# Patient Record
Sex: Female | Born: 1980 | Hispanic: No | Marital: Married | Smoking: Never smoker
Health system: Southern US, Community
[De-identification: ages and names within clinical notes are randomized; demographics above are authoritative.]

## PROBLEM LIST (undated history)

## (undated) DIAGNOSIS — K5792 Diverticulitis of intestine, part unspecified, without perforation or abscess without bleeding: Secondary | ICD-10-CM

## (undated) HISTORY — PX: CHOLECYSTECTOMY: SHX55

## (undated) HISTORY — PX: TUBAL LIGATION: SHX77

## (undated) HISTORY — PX: APPENDECTOMY: SHX54

---

## 2002-10-24 ENCOUNTER — Inpatient Hospital Stay (HOSPITAL_COMMUNITY): Admission: AD | Admit: 2002-10-24 | Discharge: 2002-10-24 | Payer: Self-pay | Admitting: Family Medicine

## 2012-11-29 ENCOUNTER — Ambulatory Visit: Payer: Self-pay | Admitting: Family Medicine

## 2013-01-30 ENCOUNTER — Ambulatory Visit: Payer: Self-pay | Admitting: Family Medicine

## 2013-04-08 ENCOUNTER — Inpatient Hospital Stay: Payer: Self-pay | Admitting: Obstetrics and Gynecology

## 2013-04-08 LAB — CBC WITH DIFFERENTIAL/PLATELET
Basophil #: 0.1 10*3/uL (ref 0.0–0.1)
Basophil %: 1.1 %
Eosinophil #: 0.2 10*3/uL (ref 0.0–0.7)
HGB: 13.7 g/dL (ref 12.0–16.0)
MCV: 95 fL (ref 80–100)
Monocyte #: 0.8 x10 3/mm (ref 0.2–0.9)
Neutrophil #: 6.3 10*3/uL (ref 1.4–6.5)
Neutrophil %: 61 %
RBC: 4.12 10*6/uL (ref 3.80–5.20)
RDW: 13.4 % (ref 11.5–14.5)

## 2013-04-09 LAB — HEMOGLOBIN: HGB: 12.1 g/dL (ref 12.0–16.0)

## 2013-04-10 LAB — HEMATOCRIT: HCT: 33.7 % — ABNORMAL LOW (ref 35.0–47.0)

## 2013-04-10 LAB — PATHOLOGY REPORT

## 2013-04-11 LAB — CBC WITH DIFFERENTIAL/PLATELET
Basophil #: 0 10*3/uL (ref 0.0–0.1)
Basophil %: 0.3 %
Eosinophil #: 0.3 10*3/uL (ref 0.0–0.7)
Eosinophil %: 2.9 %
HCT: 32 % — ABNORMAL LOW (ref 35.0–47.0)
HGB: 11 g/dL — ABNORMAL LOW (ref 12.0–16.0)
Monocyte #: 0.7 x10 3/mm (ref 0.2–0.9)
Monocyte %: 7.1 %
RDW: 13.4 % (ref 11.5–14.5)
WBC: 9.9 10*3/uL (ref 3.6–11.0)

## 2015-03-12 NOTE — Op Note (Signed)
PATIENT NAME:  Barbara Gregory, Barbara Gregory MR#:  [id number] DATE OF BIRTH:  1981/04/19  DATE OF PROCEDURE:  04/08/2013  PREOPERATIVE DIAGNOSES: 1.  Term gestation.  2.  Active labor.  3.  Previous cesarean section.  4.  Desires repeat cesarean section.  5.  Desires tubal ligation.   POSTOPERATIVE DIAGNOSES:  1.  Term gestation.  2.  Active labor.  3.  Previous cesarean section.  4.  Desires repeat cesarean section.  5.  Desires tubal ligation. 6.  Infant with suspected trisomy 26.   PROCEDURE:  Repeat low transverse cesarean section, bilateral tubal ligation and On-Q pump placement.    SURGEON:  Ricky L. Logan Bores, M.D.   ASSISTBabette Relic.   FINDINGS:  Postsurgical abdomen, grossly normal uterus, tubes, ovaries, placenta.   ESTIMATED BLOOD LOSS:  500 mL.   COMPLICATIONS:  None.   DRAINS:  Foley.    PREOPERATIVE ANTIBIOTICS:  2 grams Ancef IV.   PAIN MANAGEMENT:  On-Q pump placement.   PROCEDURE IN DETAIL:  The patient presented in active labor at 4 cm, 100% and minus 2.  The patient reiterated desire for repeat cesarean and for tubal ligation.   The patient pointed out she had a piercing in her mid-sternum.  We have informed her that she is at risk for thermal burn due to the fact we are using monopolar Bovie cautery.  She states she did not desire the piercing removed, therefore we told her we would place tape on it and we will instruct her to keep it moist and general wound care should a burn develop.  She stated understanding and agreed.   After obtaining consent, the patient was taken to the operating room and placed in the sitting position where spinal was placed and placed in the supine position.  Foley catheter was inserted.  Abdomen was prepped and draped in the usual sterile fashion.  After assuring adequate anesthesia, a #10 blade was used to create a Pfannenstiel incision through old scar.  Primary low transverse cesarean section was carried out in the usual fashion.  The uterine cavity  was manually wiped with a towel.  Utetrus exteriorized placenta was removed.  The incision was closed with running interlocking of 0 chromic.  O'Leary stitch was required at the left uterine artery.  Next, we turned our attention to the fallopian tubes.    The right fallopian tube was visualized and grasped in relative avascular portion at the distal portion of the ampullary region (to avoid a large vein in the mesosalpinx) and a modified Pomeroy with a double ligation of 0 plain gut was carried out.  The segment of tube was cut free, sent as right segment and stumps were cauterized.  Procedure in a similar fashion on the left.   The uterus was returned to the abdominal cavity, copiously irrigated.  Surgical sites x 3 were seen to be hemostatic.  The rectus muscle was hemostatic.  The fascia was closed left to midline, and then On-Q catheters were placed and draped bilaterally over the underlying rectus and then the rest of the fascia was closed by continuing the previously begun running of 0 Vicryl.  SubQ was made hemostatic with cautery.  The skin was closed with absorbable clips, and On-Q catheters were secured using skin glue, Steri-Strips.  They were flushed with 5 mL of Sensorcaine into each and secured with Tegaderm.  Catheters were labeled.    All instrument, needle and sponge counts were correct.  The patient tolerated the  procedure well.  The urine was clear throughout procedure and at the end of procedure.  I anticipate a routine postoperative course.    ____________________________ Reatha Harpsicky L. Logan BoresEvans, MD rle:ea D: 04/08/2013 23:06:14 ET T: 04/08/2013 23:22:38 ET JOB#: [id number]  cc: Clide Clifficky L. Logan BoresEvans, MD, <Dictator> Augustina MoodICK L Omayra Tulloch MD ELECTRONICALLY SIGNED 04/10/2013 9:07

## 2015-03-30 NOTE — H&P (Signed)
L&D Evaluation:  History:  HPI 34 Y/O P1 with [facility]   Presents with contractions   Patient's Medical History No Chronic Illness   Patient's Surgical History Previous C-Section   Medications Pre Natal Vitamins   Allergies NKDA   Social History none   Family History Non-Contributory   ROS:  ROS All systems were reviewed.  HEENT, CNS, GI, GU, Respiratory, CV, Renal and Musculoskeletal systems were found to be normal.   Exam:  Vital Signs stable   Mental Status clear   Abdomen gravid, tender with contractions   Estimated Fetal Weight Average for gestational age   Back no CVAT   Pelvic 4/90/-2, VTX   Mebranes Intact   FHT normal rate with no decels   Ucx regular   Other has piercing at center of chest -- pt does not desire removal She is aware of potential for skin burn at this location, as monopolar bovie is typically used Will tape as securely as we can and give instructions to keep moist if burn develops   Impression:  Impression active labor   Plan:  Comments rLTCS Desires BTL -- will perform if paperwork is located   Electronic Signatures: Margaretha GlassingEvans, Ricky L (MD)  (Signed 20-May-14 22:17)  Authored: L&D Evaluation   Last Updated: 20-May-14 22:17 by Margaretha GlassingEvans, Ricky L (MD)

## 2016-01-03 ENCOUNTER — Encounter: Payer: Self-pay | Admitting: Emergency Medicine

## 2016-01-03 ENCOUNTER — Emergency Department
Admission: EM | Admit: 2016-01-03 | Discharge: 2016-01-03 | Disposition: A | Discharge status: Home / Self Care | Payer: Self-pay | Attending: Emergency Medicine | Admitting: Emergency Medicine

## 2016-01-03 DIAGNOSIS — R5383 Other fatigue: Secondary | ICD-10-CM | POA: Insufficient documentation

## 2016-01-03 DIAGNOSIS — Z3202 Encounter for pregnancy test, result negative: Secondary | ICD-10-CM | POA: Insufficient documentation

## 2016-01-03 DIAGNOSIS — R5381 Other malaise: Secondary | ICD-10-CM | POA: Insufficient documentation

## 2016-01-03 LAB — CBC WITH DIFFERENTIAL/PLATELET
BASOS ABS: 0 10*3/uL (ref 0–0.1)
BASOS PCT: 1 %
EOS PCT: 2 %
Eosinophils Absolute: 0.2 10*3/uL (ref 0–0.7)
HCT: 38.1 % (ref 35.0–47.0)
Hemoglobin: 12.6 g/dL (ref 12.0–16.0)
Lymphocytes Relative: 27 %
Lymphs Abs: 2.3 10*3/uL (ref 1.0–3.6)
MCH: 30.5 pg (ref 26.0–34.0)
MCHC: 33.1 g/dL (ref 32.0–36.0)
MCV: 92.2 fL (ref 80.0–100.0)
MONO ABS: 0.9 10*3/uL (ref 0.2–0.9)
MONOS PCT: 10 %
Neutro Abs: 5.2 10*3/uL (ref 1.4–6.5)
Neutrophils Relative %: 60 %
PLATELETS: 239 10*3/uL (ref 150–440)
RBC: 4.13 MIL/uL (ref 3.80–5.20)
RDW: 13.5 % (ref 11.5–14.5)
WBC: 8.6 10*3/uL (ref 3.6–11.0)

## 2016-01-03 LAB — URINALYSIS COMPLETE WITH MICROSCOPIC (ARMC ONLY)
Bilirubin Urine: NEGATIVE
GLUCOSE, UA: NEGATIVE mg/dL
LEUKOCYTES UA: NEGATIVE
NITRITE: NEGATIVE
PH: 6 (ref 5.0–8.0)
Protein, ur: NEGATIVE mg/dL
Specific Gravity, Urine: 1.009 (ref 1.005–1.030)

## 2016-01-03 LAB — COMPREHENSIVE METABOLIC PANEL
ALBUMIN: 4 g/dL (ref 3.5–5.0)
ALK PHOS: 72 U/L (ref 38–126)
ALT: 24 U/L (ref 14–54)
AST: 19 U/L (ref 15–41)
Anion gap: 7 (ref 5–15)
BILIRUBIN TOTAL: 0.8 mg/dL (ref 0.3–1.2)
BUN: 16 mg/dL (ref 6–20)
CALCIUM: 8.7 mg/dL — AB (ref 8.9–10.3)
CO2: 25 mmol/L (ref 22–32)
Chloride: 105 mmol/L (ref 101–111)
Creatinine, Ser: 0.57 mg/dL (ref 0.44–1.00)
GFR calc Af Amer: >60 mL/min (ref >60)
GFR calc non Af Amer: >60 mL/min (ref >60)
GLUCOSE: 96 mg/dL (ref 65–99)
Potassium: 3.4 mmol/L — ABNORMAL LOW (ref 3.5–5.1)
Sodium: 137 mmol/L (ref 135–145)
TOTAL PROTEIN: 7.7 g/dL (ref 6.5–8.1)

## 2016-01-03 LAB — PREGNANCY, URINE: Preg Test, Ur: NEGATIVE

## 2016-01-03 MED ORDER — NAPROXEN 500 MG PO TBEC: 500.0000 mg | DELAYED_RELEASE_TABLET | Freq: Two times a day (BID) | ORAL | Status: DC

## 2016-01-03 MED ORDER — NAPROXEN 500 MG PO TABS
500.0000 mg | ORAL_TABLET | Freq: Once | ORAL | Status: AC
  Administered 2016-01-03: 500 mg via ORAL

## 2016-01-03 MED ORDER — NAPROXEN 500 MG PO TABS
ORAL_TABLET | ORAL | Status: AC
  Filled 2016-01-03: qty 1

## 2016-01-03 MED ORDER — CYCLOBENZAPRINE HCL 5 MG PO TABS: 5.0000 mg | ORAL_TABLET | Freq: Three times a day (TID) | ORAL | Status: DC | PRN

## 2016-01-03 NOTE — ED Notes (Signed)
POCT Urine Preg negative. 

## 2016-01-03 NOTE — Discharge Instructions (Signed)
Fatigue Fatigue is feeling tired all of the time, a lack of energy, or a lack of motivation. Occasional or mild fatigue is often a normal response to activity or life in general. However, long-lasting (chronic) or extreme fatigue may indicate an underlying medical condition. HOME CARE INSTRUCTIONS  Watch your fatigue for any changes. The following actions may help to lessen any discomfort you are feeling:  Talk to your health care provider about how much sleep you need each night. Try to get the required amount every night.  Take medicines only as directed by your health care provider.  Eat a healthy and nutritious diet. Ask your health care provider if you need help changing your diet.  Drink enough fluid to keep your urine clear or pale yellow.  Practice ways of relaxing, such as yoga, meditation, massage therapy, or acupuncture.  Exercise regularly.   Change situations that cause you stress. Try to keep your work and personal routine reasonable.  Do not abuse illegal drugs.  Limit alcohol intake to no more than 1 drink per day for nonpregnant women and 2 drinks per day for men. One drink equals 12 ounces of beer, 5 ounces of wine, or 1 ounces of hard liquor.  Take a multivitamin, if directed by your health care provider. SEEK MEDICAL CARE IF:   Your fatigue does not get better.  You have a fever.   You have unintentional weight loss or gain.  You have headaches.   You have difficulty:   Falling asleep.  Sleeping throughout the night.  You feel angry, guilty, anxious, or sad.   You are unable to have a bowel movement (constipation).   You skin is dry.   Your legs or another part of your body is swollen.  SEEK IMMEDIATE MEDICAL CARE IF:   You feel confused.   Your vision is blurry.  You feel faint or pass out.   You have a severe headache.   You have severe abdominal, pelvic, or back pain.   You have chest pain, shortness of breath, or an  irregular or fast heartbeat.   You are unable to urinate or you urinate less than normal.   You develop abnormal bleeding, such as bleeding from the rectum, vagina, nose, lungs, or nipples.  You vomit blood.   You have thoughts about harming yourself or committing suicide.   You are worried that you might harm someone else.    This information is not intended to replace advice given to you by your health care provider. Make sure you discuss any questions you have with your health care provider.   Document Released: 09/03/2007 Document Revised: 11/27/2014 Document Reviewed: 03/10/2014 Elsevier Interactive Patient Education [employer]   Your exam and labs were normal today. Continue to monitor symptoms and follow-up with your provider as planned.

## 2016-01-03 NOTE — ED Notes (Signed)
Body aches and breast tenderness x 2 days. Denies fevers.

## 2016-01-03 NOTE — ED Provider Notes (Signed)
[facility] Emergency Department Provider Note ____________________________________________  Time seen: 1906  I have reviewed the triage vital signs and the nursing notes.  HISTORY  Chief Complaint  Generalized Body Aches  HPI Barbara Gregory is a 35 y.o. female visits to the ED with multiple complaints of generalized body aches from the waist up. She describes the symptoms began on Friday stating that "it hurts to touch". She is describing tenderness to her breast, chest wall and flank as well as some neck pain as with stiffness and tightness. She denies any vomiting but reports nausea. She describes she has felt as if her mouth has been dry. Denies any injury, trauma, accident, fall, or bruising. She denies any excessive urination, irritability or weight changes. She does admit to some increased fatigue and sleepiness. She denies any recent travel, sick contacts, or other exposures. She denies any rashes, tick bites, bruising, or unusual bleeding. She also denies any cough, or congestion. She denies any upper respiratory symptoms wheezing, or flulike symptoms. She's been dosing her regular medications without change.She did not receive a flu vaccine for the season. She has dosed Tylenol intermittently for her pain. She reports her pain at a 10/10 in triage.  History reviewed. No pertinent past medical history.  There are no active problems to display for this patient.  Past Surgical History  Procedure Laterality Date  . Cholecystectomy    . Tubal ligation     Current Outpatient Rx  Name  Route  Sig  Dispense  Refill  . cyclobenzaprine (FLEXERIL) 5 MG tablet   Oral   Take 1 tablet (5 mg total) by mouth 3 (three) times daily as needed for muscle spasms.   15 tablet   0   . naproxen (EC NAPROSYN) 500 MG EC tablet   Oral   Take 1 tablet (500 mg total) by mouth 2 (two) times daily with a meal.   30 tablet   0     Allergies Review of patient's allergies  indicates no known allergies.  No family history on file.  Social History Social History  Substance Use Topics  . Smoking status: Never Smoker   . Smokeless tobacco: None  . Alcohol Use: No    Review of Systems  Constitutional: Negative for fever. Eyes: Negative for visual changes. ENT: Negative for sore throat. Cardiovascular: Negative for chest pain. Respiratory: Negative for shortness of breath. Gastrointestinal: Negative for abdominal pain, vomiting and diarrhea. Genitourinary: Negative for dysuria. Musculoskeletal: Negative for back pain. Skin: Negative for rash. Neurological: Negative for headaches, focal weakness or numbness. ____________________________________________  PHYSICAL EXAM:  VITAL SIGNS: ED Triage Vitals  Enc Vitals Group     BP 01/03/16 1807 144/82 mmHg     Pulse Rate 01/03/16 1807 79     Resp 01/03/16 1807 20     Temp 01/03/16 1807 98.2 F (36.8 C)     Temp Source 01/03/16 1807 Oral     SpO2 01/03/16 1807 99 %     Weight 01/03/16 1807 173 lb (78.472 kg)     Height 01/03/16 1807  (1.448 m)     Head Cir --      Peak Flow --      Pain Score 01/03/16 1807 10     Pain Loc --      Pain Edu? --      Excl. in GC? --    Constitutional: Alert and oriented. Well appearing and in no distress. Head: Normocephalic and atraumatic.  Eyes: Conjunctivae are normal. PERRL. Normal extraocular movements      Ears: Canals clear. TMs intact bilaterally.   Nose: No congestion/rhinorrhea.   Mouth/Throat: Mucous membranes are moist. Lids midline tonsils are flat. No oral lesions are appreciated.   Neck: Supple. No thyromegaly. General neck tenderness to palpation. Normal neck range of motion in all planes. Hematological/Lymphatic/Immunological: No cervical, supra/infraclavicular lymphadenopathy. Cardiovascular: Normal rate, regular rhythm.  Respiratory: Normal respiratory effort. No wheezes/rales/rhonchi. Gastrointestinal: Soft and nontender. No  distention, rebound, guarding, or organomegaly. Normal bowel sounds x 4 quadrants.  Musculoskeletal: Normal spinal alignment. No midline tenderness, spasm, deformity, or step-off. Nontender with normal range of motion in all extremities.  Neurologic:  Normal gait without ataxia. Normal speech and language. No gross focal neurologic deficits are appreciated. Skin:  Skin is warm, dry and intact. No rash noted. Psychiatric: Mood and affect are normal. Patient exhibits appropriate insight and judgment. ____________________________________________   LABS (pertinent positives/negatives) Labs Reviewed  COMPREHENSIVE METABOLIC PANEL - Abnormal; Notable for the following:    Potassium 3.4 (*)    Calcium 8.7 (*)    All other components within normal limits  URINALYSIS COMPLETEWITH MICROSCOPIC ([facility] ONLY) - Abnormal; Notable for the following:    Color, Urine STRAW (*)    APPearance CLEAR (*)    Ketones, ur 1+ (*)    Hgb urine dipstick 1+ (*)    Bacteria, UA RARE (*)    Squamous Epithelial / LPF 0-5 (*)    All other components within normal limits  CBC WITH DIFFERENTIAL/PLATELET  PREGNANCY, URINE  ____________________________________________  PROCEDURES  EC Naprosyn 500 mg PO ____________________________________________  INITIAL IMPRESSION / ASSESSMENT AND PLAN / ED COURSE  Patient with generalized bodyaches without clear etiology. She is reassured that her labs are normal at this time. She is encouraged to dose the prescription Naprosyn and Flexeril and follow with primary care provider as she has scheduled tomorrow morning at 9 AM. She is advised to return to the ED for acutely worsening symptoms. ____________________________________________  FINAL CLINICAL IMPRESSION(S) / ED DIAGNOSES  Final diagnoses:  Malaise and fatigue      Lissa Hoard, PA-C 01/03/16 2351  Minna Antis, MD 01/03/16 2357

## 2016-01-03 NOTE — ED Notes (Addendum)
Pt in via triage w/ complaints of generalized body aches, breast tenderness/swelling since Friday, stating "it hurts to touch."  Pt reports dry mouth, nausea, blurred vision.  Pt reports decreased appetite but has been drinking plenty of water.  Pt denies any fever, denies vomiting/diarrhea.  Pt appears uncomfortable, no immediate distress at this time.

## 2017-07-20 ENCOUNTER — Ambulatory Visit: Payer: Self-pay | Admitting: Family Medicine

## 2020-02-16 ENCOUNTER — Ambulatory Visit: Payer: Self-pay | Attending: Internal Medicine

## 2020-02-16 DIAGNOSIS — Z23 Encounter for immunization: Secondary | ICD-10-CM

## 2020-02-16 NOTE — Progress Notes (Signed)
   Covid-19 Vaccination Clinic  Name:  Barbara Gregory    MRN: [id number] DOB: 1981/06/26  02/16/2020  [name] was observed post Covid-19 immunization for 15 minutes without incident. She was provided with Vaccine Information Sheet and instruction to access the V-Safe system.   [name] was instructed to call 911 with any severe reactions post vaccine: Marland Kitchen Difficulty breathing  . Swelling of face and throat  . A fast heartbeat  . A bad rash all over body  . Dizziness and weakness   Immunizations Administered    Name Date Dose VIS Date Route   Pfizer COVID-19 Vaccine 02/16/2020  3:06 PM 0.3 mL 10/31/2019 Intramuscular   Manufacturer: [employer], [employer]   Lot: [id number]   NDC: [address]-4

## 2020-02-25 ENCOUNTER — Emergency Department: Payer: Self-pay

## 2020-02-25 ENCOUNTER — Encounter: Payer: Self-pay | Admitting: Emergency Medicine

## 2020-02-25 ENCOUNTER — Other Ambulatory Visit: Payer: Self-pay

## 2020-02-25 ENCOUNTER — Emergency Department
Admission: EM | Admit: 2020-02-25 | Discharge: 2020-02-25 | Disposition: A | Discharge status: Home / Self Care | Payer: Self-pay | Attending: Emergency Medicine | Admitting: Emergency Medicine

## 2020-02-25 DIAGNOSIS — R1032 Left lower quadrant pain: Secondary | ICD-10-CM

## 2020-02-25 DIAGNOSIS — Z79899 Other long term (current) drug therapy: Secondary | ICD-10-CM | POA: Insufficient documentation

## 2020-02-25 DIAGNOSIS — K5792 Diverticulitis of intestine, part unspecified, without perforation or abscess without bleeding: Secondary | ICD-10-CM | POA: Insufficient documentation

## 2020-02-25 LAB — COMPREHENSIVE METABOLIC PANEL
ALT: 20 U/L (ref 0–44)
AST: 17 U/L (ref 15–41)
Albumin: 3.9 g/dL (ref 3.5–5.0)
Alkaline Phosphatase: 75 U/L (ref 38–126)
Anion gap: 6 (ref 5–15)
BUN: 13 mg/dL (ref 6–20)
CO2: 27 mmol/L (ref 22–32)
Calcium: 8.9 mg/dL (ref 8.9–10.3)
Chloride: 103 mmol/L (ref 98–111)
Creatinine, Ser: 0.44 mg/dL (ref 0.44–1.00)
GFR calc Af Amer: >60 mL/min (ref >60)
GFR calc non Af Amer: >60 mL/min (ref >60)
Glucose, Bld: 97 mg/dL (ref 70–99)
Potassium: 4.4 mmol/L (ref 3.5–5.1)
Sodium: 136 mmol/L (ref 135–145)
Total Bilirubin: 0.8 mg/dL (ref 0.3–1.2)
Total Protein: 7.8 g/dL (ref 6.5–8.1)

## 2020-02-25 LAB — CBC
HCT: 37.3 % (ref 36.0–46.0)
Hemoglobin: 12.5 g/dL (ref 12.0–15.0)
MCH: 30.9 pg (ref 26.0–34.0)
MCHC: 33.5 g/dL (ref 30.0–36.0)
MCV: 92.1 fL (ref 80.0–100.0)
Platelets: 284 10*3/uL (ref 150–400)
RBC: 4.05 MIL/uL (ref 3.87–5.11)
RDW: 13.2 % (ref 11.5–15.5)
WBC: 11.1 10*3/uL — ABNORMAL HIGH (ref 4.0–10.5)
nRBC: 0 % (ref 0.0–0.2)

## 2020-02-25 LAB — URINALYSIS, COMPLETE (UACMP) WITH MICROSCOPIC
Bacteria, UA: NONE SEEN
Bilirubin Urine: NEGATIVE
Glucose, UA: NEGATIVE mg/dL
Hgb urine dipstick: NEGATIVE
Ketones, ur: NEGATIVE mg/dL
Leukocytes,Ua: NEGATIVE
Nitrite: NEGATIVE
Protein, ur: NEGATIVE mg/dL
Specific Gravity, Urine: 1.006 (ref 1.005–1.030)
pH: 6 (ref 5.0–8.0)

## 2020-02-25 LAB — PREGNANCY, URINE: Preg Test, Ur: NEGATIVE

## 2020-02-25 LAB — LIPASE, BLOOD: Lipase: 30 U/L (ref 11–51)

## 2020-02-25 MED ORDER — MORPHINE SULFATE (PF) 4 MG/ML IV SOLN
4.0000 mg | Freq: Once | INTRAVENOUS | Status: AC
  Administered 2020-02-25: 4 mg via INTRAVENOUS
  Filled 2020-02-25: qty 1

## 2020-02-25 MED ORDER — ONDANSETRON HCL 4 MG/2ML IJ SOLN
4.0000 mg | Freq: Once | INTRAMUSCULAR | Status: AC
  Administered 2020-02-25: 4 mg via INTRAVENOUS
  Filled 2020-02-25: qty 2

## 2020-02-25 MED ORDER — IOHEXOL 300 MG/ML  SOLN
100.0000 mL | Freq: Once | INTRAMUSCULAR | Status: AC | PRN
  Administered 2020-02-25: 100 mL via INTRAVENOUS
  Filled 2020-02-25: qty 100

## 2020-02-25 MED ORDER — SODIUM CHLORIDE 0.9% FLUSH: 3.0000 mL | Freq: Once | INTRAVENOUS | Status: DC

## 2020-02-25 MED ORDER — AMOXICILLIN-POT CLAVULANATE 875-125 MG PO TABS: 1.0000 | ORAL_TABLET | Freq: Two times a day (BID) | ORAL | 0 refills | Status: DC

## 2020-02-25 MED ORDER — AMOXICILLIN-POT CLAVULANATE 875-125 MG PO TABS
1.0000 | ORAL_TABLET | Freq: Once | ORAL | Status: AC
  Administered 2020-02-25: 1 via ORAL
  Filled 2020-02-25: qty 1

## 2020-02-25 MED ORDER — LACTATED RINGERS IV BOLUS
1000.0000 mL | Freq: Once | INTRAVENOUS | Status: AC
  Administered 2020-02-25: 1000 mL via INTRAVENOUS

## 2020-02-25 MED ORDER — ONDANSETRON 4 MG PO TBDP: 4.0000 mg | ORAL_TABLET | Freq: Three times a day (TID) | ORAL | 0 refills | Status: DC | PRN

## 2020-02-25 NOTE — ED Notes (Signed)
Pt transported to Ct scan.

## 2020-02-25 NOTE — ED Triage Notes (Signed)
Pt comes via POV from home with c/o lower abdominal pain that started Friday night. Pt states it has gotten worse.  Pt denies any urinary pain or burning. Pt states urgency to pee.

## 2020-02-25 NOTE — ED Notes (Signed)
This RN unable to obtain e-signature due to computer not working at this time

## 2020-02-25 NOTE — ED Provider Notes (Signed)
[facility] Emergency Department Provider Note   ____________________________________________   First MD Initiated Contact with Patient 02/25/20 1317     (approximate)  I have reviewed the triage vital signs and the nursing notes.   HISTORY  Chief Complaint Abdominal Pain    HPI Barbara Gregory is a 39 y.o. female with no significant past medical history who presents to the ED complaining of abdominal pain.  Patient reports that she has had 3 days of constant bilateral lower quadrant abdominal pain, worse on the left side.  Pain is described as sharp and not exacerbated or alleviated by anything, has been gradually worsening to become severe today.  She has started feeling nauseous today with a poor appetite, but has not had any vomiting or changes in her bowel movements.  She reports having a normal bowel movement about 30 minutes prior to arrival.  Her LMP was on March 28 and she denies any vaginal bleeding or discharge.  She does state that she feels some pressure when she goes to urinate, but denies any dysuria, hematuria, or flank pain.  She was initially evaluated at her PCPs office and referred to the ED for further evaluation.        History reviewed. No pertinent past medical history.  There are no problems to display for this patient.   Past Surgical History:  Procedure Laterality Date  . CHOLECYSTECTOMY    . TUBAL LIGATION      Prior to Admission medications   Medication Sig Start Date End Date Taking? Authorizing Provider  amoxicillin-clavulanate (AUGMENTIN) 875-125 MG tablet Take 1 tablet by mouth 2 (two) times daily for 10 days. 02/25/20 03/06/20  Blake Divine, MD  cyclobenzaprine (FLEXERIL) 5 MG tablet Take 1 tablet (5 mg total) by mouth 3 (three) times daily as needed for muscle spasms. 01/03/16   Menshew, Dannielle Karvonen, PA-C  naproxen (EC NAPROSYN) 500 MG EC tablet Take 1 tablet (500 mg total) by mouth 2 (two) times daily with a meal.  01/03/16   Menshew, Dannielle Karvonen, PA-C  ondansetron (ZOFRAN ODT) 4 MG disintegrating tablet Take 1 tablet (4 mg total) by mouth every 8 (eight) hours as needed for nausea or vomiting. 02/25/20   Blake Divine, MD    Allergies Patient has no known allergies.  No family history on file.  Social History Social History   Tobacco Use  . Smoking status: Never Smoker  Substance Use Topics  . Alcohol use: No  . Drug use: Not on file    Review of Systems  Constitutional: No fever/chills Eyes: No visual changes. ENT: No sore throat. Cardiovascular: Denies chest pain. Respiratory: Denies shortness of breath. Gastrointestinal: Positive for abdominal pain.  Positive for nausea, no vomiting.  No diarrhea.  No constipation. Genitourinary: Negative for dysuria.  Positive for urinary urgency. Musculoskeletal: Negative for back pain. Skin: Negative for rash. Neurological: Negative for headaches, focal weakness or numbness.  ____________________________________________   PHYSICAL EXAM:  VITAL SIGNS: ED Triage Vitals  Enc Vitals Group     BP 02/25/20 1155 123/82     Pulse Rate 02/25/20 1155 92     Resp 02/25/20 1155 18     Temp 02/25/20 1155 98.6 F (37 C)     Temp Source 02/25/20 1155 Oral     SpO2 02/25/20 1155 99 %     Weight 02/25/20 1155 163 lb (73.9 kg)     Height 02/25/20 1155 4\' 11"  (1.499 m)  Head Circumference --      Peak Flow --      Pain Score 02/25/20 1205 8     Pain Loc --      Pain Edu? --      Excl. in GC? --     Constitutional: Alert and oriented. Eyes: Conjunctivae are normal. Head: Atraumatic. Nose: No congestion/rhinnorhea. Mouth/Throat: Mucous membranes are moist. Neck: Normal ROM Cardiovascular: Normal rate, regular rhythm. Grossly normal heart sounds. Respiratory: Normal respiratory effort.  No retractions. Lungs CTAB. Gastrointestinal: Soft and tender to palpation of the bilateral lower quadrants, left greater than right. No distention.  Genitourinary: deferred Musculoskeletal: No lower extremity tenderness nor edema. Neurologic:  Normal speech and language. No gross focal neurologic deficits are appreciated. Skin:  Skin is warm, dry and intact. No rash noted. Psychiatric: Mood and affect are normal. Speech and behavior are normal.  ____________________________________________   LABS (all labs ordered are listed, but only abnormal results are displayed)  Labs Reviewed  CBC - Abnormal; Notable for the following components:      Result Value   WBC 11.1 (*)    All other components within normal limits  URINALYSIS, COMPLETE (UACMP) WITH MICROSCOPIC - Abnormal; Notable for the following components:   Color, Urine STRAW (*)    APPearance HAZY (*)    All other components within normal limits  LIPASE, BLOOD  COMPREHENSIVE METABOLIC PANEL  PREGNANCY, URINE     PROCEDURES  Procedure(s) performed (including Critical Care):  Procedures   ____________________________________________   INITIAL IMPRESSION / ASSESSMENT AND PLAN / ED COURSE       39 year old female presents to the ED with 3 days of gradually worsening bilateral lower quadrant abdominal pain, left greater than right.  She does have significant tenderness in her left lower quadrant and we will further assess with CT scan.  Differential includes diverticulitis, nephrolithiasis, UTI, pyelonephritis, gastroenteritis, pregnancy.  Lab work thus far is unremarkable and UA does not appear consistent with infection.  Pregnancy testing pending.  CT scan is consistent with diverticulitis and patient given initial dose of Augmentin here in the ED.  Pain is now improved and she is appropriate for discharge home, counseled to follow-up with PCP and otherwise return to the ED for new or worsening symptoms.  Patient agrees with plan.      ____________________________________________   FINAL CLINICAL IMPRESSION(S) / ED DIAGNOSES  Final diagnoses:  Diverticulitis   LLQ abdominal pain     ED Discharge Orders         Ordered    amoxicillin-clavulanate (AUGMENTIN) 875-125 MG tablet  2 times daily     02/25/20 1453    ondansetron (ZOFRAN ODT) 4 MG disintegrating tablet  Every 8 hours PRN     02/25/20 1453           Note:  This document was prepared using Dragon voice recognition software and may include unintentional dictation errors.   Chesley Noon, MD 02/25/20 1454

## 2020-02-25 NOTE — ED Notes (Signed)
Pt ambulatory to bathroom with steady gait.

## 2020-03-04 ENCOUNTER — Emergency Department: Payer: Medicaid Other

## 2020-03-04 ENCOUNTER — Other Ambulatory Visit: Payer: Self-pay

## 2020-03-04 ENCOUNTER — Inpatient Hospital Stay
Admission: EM | Admit: 2020-03-04 | Discharge: 2020-03-09 | DRG: 392 | Disposition: A | Discharge status: Home / Self Care | Payer: Medicaid Other | Attending: Internal Medicine | Admitting: Internal Medicine

## 2020-03-04 DIAGNOSIS — K5732 Diverticulitis of large intestine without perforation or abscess without bleeding: Secondary | ICD-10-CM | POA: Diagnosis not present

## 2020-03-04 DIAGNOSIS — Z20822 Contact with and (suspected) exposure to covid-19: Secondary | ICD-10-CM | POA: Diagnosis present

## 2020-03-04 DIAGNOSIS — E871 Hypo-osmolality and hyponatremia: Secondary | ICD-10-CM | POA: Diagnosis present

## 2020-03-04 DIAGNOSIS — Z6832 Body mass index (BMI) 32.0-32.9, adult: Secondary | ICD-10-CM | POA: Diagnosis not present

## 2020-03-04 DIAGNOSIS — K5792 Diverticulitis of intestine, part unspecified, without perforation or abscess without bleeding: Secondary | ICD-10-CM

## 2020-03-04 LAB — URINALYSIS, COMPLETE (UACMP) WITH MICROSCOPIC
Bacteria, UA: NONE SEEN
Bilirubin Urine: NEGATIVE
Glucose, UA: NEGATIVE mg/dL
Hgb urine dipstick: NEGATIVE
Ketones, ur: NEGATIVE mg/dL
Leukocytes,Ua: NEGATIVE
Nitrite: NEGATIVE
Protein, ur: NEGATIVE mg/dL
Specific Gravity, Urine: 1.01 (ref 1.005–1.030)
pH: 5 (ref 5.0–8.0)

## 2020-03-04 LAB — COMPREHENSIVE METABOLIC PANEL
ALT: 27 U/L (ref 0–44)
AST: 21 U/L (ref 15–41)
Albumin: 4 g/dL (ref 3.5–5.0)
Alkaline Phosphatase: 95 U/L (ref 38–126)
Anion gap: 8 (ref 5–15)
BUN: 13 mg/dL (ref 6–20)
CO2: 24 mmol/L (ref 22–32)
Calcium: 9 mg/dL (ref 8.9–10.3)
Chloride: 102 mmol/L (ref 98–111)
Creatinine, Ser: 0.5 mg/dL (ref 0.44–1.00)
GFR calc Af Amer: >60 mL/min (ref >60)
GFR calc non Af Amer: >60 mL/min (ref >60)
Glucose, Bld: 95 mg/dL (ref 70–99)
Potassium: 4.1 mmol/L (ref 3.5–5.1)
Sodium: 134 mmol/L — ABNORMAL LOW (ref 135–145)
Total Bilirubin: 0.7 mg/dL (ref 0.3–1.2)
Total Protein: 8.3 g/dL — ABNORMAL HIGH (ref 6.5–8.1)

## 2020-03-04 LAB — CBC
HCT: 35.9 % — ABNORMAL LOW (ref 36.0–46.0)
Hemoglobin: 12.1 g/dL (ref 12.0–15.0)
MCH: 30.6 pg (ref 26.0–34.0)
MCHC: 33.7 g/dL (ref 30.0–36.0)
MCV: 90.9 fL (ref 80.0–100.0)
Platelets: 358 10*3/uL (ref 150–400)
RBC: 3.95 MIL/uL (ref 3.87–5.11)
RDW: 12.9 % (ref 11.5–15.5)
WBC: 12.6 10*3/uL — ABNORMAL HIGH (ref 4.0–10.5)
nRBC: 0 % (ref 0.0–0.2)

## 2020-03-04 LAB — LIPASE, BLOOD: Lipase: 35 U/L (ref 11–51)

## 2020-03-04 LAB — SARS CORONAVIRUS 2 (TAT 6-24 HRS): SARS Coronavirus 2: NEGATIVE

## 2020-03-04 LAB — POCT PREGNANCY, URINE: Preg Test, Ur: NEGATIVE

## 2020-03-04 MED ORDER — OXYCODONE-ACETAMINOPHEN 5-325 MG PO TABS
1.0000 | ORAL_TABLET | Freq: Four times a day (QID) | ORAL | Status: DC | PRN
  Administered 2020-03-04 – 2020-03-05 (×3): 1 via ORAL
  Filled 2020-03-04 (×3): qty 1

## 2020-03-04 MED ORDER — MORPHINE SULFATE (PF) 4 MG/ML IV SOLN
4.0000 mg | Freq: Once | INTRAVENOUS | Status: AC
  Administered 2020-03-04: 17:00:00 4 mg via INTRAVENOUS
  Filled 2020-03-04: qty 1

## 2020-03-04 MED ORDER — ARIPIPRAZOLE 2 MG PO TABS
2.0000 mg | ORAL_TABLET | Freq: Every day | ORAL | Status: DC
  Administered 2020-03-05 – 2020-03-09 (×5): 2 mg via ORAL
  Filled 2020-03-04 (×5): qty 1

## 2020-03-04 MED ORDER — HEPARIN SODIUM (PORCINE) 5000 UNIT/ML IJ SOLN
5000.0000 [IU] | Freq: Three times a day (TID) | INTRAMUSCULAR | Status: DC
  Administered 2020-03-04 – 2020-03-09 (×12): 5000 [IU] via SUBCUTANEOUS
  Filled 2020-03-04 (×12): qty 1

## 2020-03-04 MED ORDER — ONDANSETRON 4 MG PO TBDP
4.0000 mg | ORAL_TABLET | Freq: Three times a day (TID) | ORAL | Status: DC | PRN
  Administered 2020-03-04 – 2020-03-08 (×5): 4 mg via ORAL
  Filled 2020-03-04 (×7): qty 1

## 2020-03-04 MED ORDER — METRONIDAZOLE IN NACL 5-0.79 MG/ML-% IV SOLN
500.0000 mg | Freq: Three times a day (TID) | INTRAVENOUS | Status: DC
  Administered 2020-03-04 – 2020-03-09 (×14): 500 mg via INTRAVENOUS
  Filled 2020-03-04 (×17): qty 100

## 2020-03-04 MED ORDER — MAGNESIUM HYDROXIDE 400 MG/5ML PO SUSP
30.0000 mL | Freq: Every day | ORAL | Status: DC | PRN
  Filled 2020-03-04: qty 30

## 2020-03-04 MED ORDER — SODIUM CHLORIDE 0.9 % IV SOLN: INTRAVENOUS | Status: DC

## 2020-03-04 MED ORDER — CIPROFLOXACIN IN D5W 400 MG/200ML IV SOLN
400.0000 mg | Freq: Once | INTRAVENOUS | Status: AC
  Administered 2020-03-04: 400 mg via INTRAVENOUS
  Filled 2020-03-04: qty 200

## 2020-03-04 MED ORDER — CIPROFLOXACIN IN D5W 400 MG/200ML IV SOLN
400.0000 mg | Freq: Two times a day (BID) | INTRAVENOUS | Status: DC
  Administered 2020-03-05 – 2020-03-09 (×9): 400 mg via INTRAVENOUS
  Filled 2020-03-04 (×12): qty 200

## 2020-03-04 MED ORDER — ACETAMINOPHEN 325 MG PO TABS
650.0000 mg | ORAL_TABLET | Freq: Four times a day (QID) | ORAL | Status: DC | PRN
  Administered 2020-03-04 – 2020-03-08 (×4): 650 mg via ORAL
  Filled 2020-03-04 (×4): qty 2

## 2020-03-04 MED ORDER — SODIUM CHLORIDE 0.9 % IV BOLUS
1000.0000 mL | Freq: Once | INTRAVENOUS | Status: AC
  Administered 2020-03-04: 17:00:00 1000 mL via INTRAVENOUS

## 2020-03-04 MED ORDER — SODIUM CHLORIDE 0.9 % IV SOLN: Freq: Once | INTRAVENOUS | Status: DC

## 2020-03-04 MED ORDER — IOHEXOL 300 MG/ML  SOLN
100.0000 mL | Freq: Once | INTRAMUSCULAR | Status: AC | PRN
  Administered 2020-03-04: 100 mL via INTRAVENOUS

## 2020-03-04 MED ORDER — CYCLOBENZAPRINE HCL 10 MG PO TABS: 5.0000 mg | ORAL_TABLET | Freq: Three times a day (TID) | ORAL | Status: DC | PRN

## 2020-03-04 MED ORDER — ONDANSETRON HCL 4 MG/2ML IJ SOLN
4.0000 mg | Freq: Once | INTRAMUSCULAR | Status: AC
  Administered 2020-03-04: 4 mg via INTRAVENOUS
  Filled 2020-03-04: qty 2

## 2020-03-04 MED ORDER — METRONIDAZOLE IN NACL 5-0.79 MG/ML-% IV SOLN
500.0000 mg | Freq: Once | INTRAVENOUS | Status: AC
  Administered 2020-03-04: 500 mg via INTRAVENOUS
  Filled 2020-03-04: qty 100

## 2020-03-04 MED ORDER — ALBUTEROL SULFATE (2.5 MG/3ML) 0.083% IN NEBU: 2.5000 mg | INHALATION_SOLUTION | RESPIRATORY_TRACT | Status: DC | PRN

## 2020-03-04 MED ORDER — SERTRALINE HCL 50 MG PO TABS
150.0000 mg | ORAL_TABLET | Freq: Every day | ORAL | Status: DC
  Administered 2020-03-04 – 2020-03-09 (×6): 150 mg via ORAL
  Filled 2020-03-04 (×6): qty 3

## 2020-03-04 NOTE — ED Provider Notes (Signed)
[facility] Emergency Department Provider Note  ____________________________________________   I have reviewed the triage vital signs and the nursing notes.   HISTORY  Chief Complaint Abdominal Pain   History limited by: Not Limited   HPI Barbara Gregory is a 39 y.o. female who presents to the emergency department today because of concerns for continued and now worsening the left lower quadrant pain.  Patient was seen in the emergency department 8 days ago and diagnosed with diverticulitis.  She states she has been taking her antibiotics.  Roughly 3 days ago her abdominal pain started getting worse.  It has been accompanied by nausea and vomiting.  The patient denies any fevers.  She noticed perhaps a small amount of blood with her stool today.  States that eating food makes the pain worse.   Records reviewed. Per medical record review patient has a history of ER visit 8 days ago, diagnosed with diverticulitis, started on antibiotics.   History reviewed. No pertinent past medical history.  There are no problems to display for this patient.   Past Surgical History:  Procedure Laterality Date  . CHOLECYSTECTOMY    . TUBAL LIGATION      Prior to Admission medications   Medication Sig Start Date End Date Taking? Authorizing Provider  amoxicillin-clavulanate (AUGMENTIN) 875-125 MG tablet Take 1 tablet by mouth 2 (two) times daily for 10 days. 02/25/20 03/06/20  Blake Divine, MD  cyclobenzaprine (FLEXERIL) 5 MG tablet Take 1 tablet (5 mg total) by mouth 3 (three) times daily as needed for muscle spasms. 01/03/16   Menshew, Dannielle Karvonen, PA-C  naproxen (EC NAPROSYN) 500 MG EC tablet Take 1 tablet (500 mg total) by mouth 2 (two) times daily with a meal. 01/03/16   Menshew, Dannielle Karvonen, PA-C  ondansetron (ZOFRAN ODT) 4 MG disintegrating tablet Take 1 tablet (4 mg total) by mouth every 8 (eight) hours as needed for nausea or vomiting. 02/25/20   Blake Divine, MD     Allergies Patient has no known allergies.  No family history on file.  Social History Social History   Tobacco Use  . Smoking status: Never Smoker  Substance Use Topics  . Alcohol use: No  . Drug use: Not on file    Review of Systems Constitutional: No fever/chills Eyes: No visual changes. ENT: No sore throat. Cardiovascular: Denies chest pain. Respiratory: Denies shortness of breath. Gastrointestinal: Positive for abdominal pain.  Genitourinary: Negative for dysuria. Musculoskeletal: Negative for back pain. Skin: Negative for rash. Neurological: Negative for headaches, focal weakness or numbness.  ____________________________________________   PHYSICAL EXAM:  VITAL SIGNS: ED Triage Vitals [03/04/20 1317]  Enc Vitals Group     BP 139/90     Pulse Rate 90     Resp 18     Temp 99.5 F (37.5 C)     Temp Source Oral     SpO2 100 %     Weight 160 lb 15 oz (73 kg)     Height 4\' 11"  (1.499 m)     Head Circumference      Peak Flow      Pain Score 7   Constitutional: Alert and oriented.  Eyes: Conjunctivae are normal.  ENT      Head: Normocephalic and atraumatic.      Nose: No congestion/rhinnorhea.      Mouth/Throat: Mucous membranes are moist.      Neck: No stridor. Hematological/Lymphatic/Immunilogical: No cervical lymphadenopathy. Cardiovascular: Normal rate, regular rhythm.  No  murmurs, rubs, or gallops.  Respiratory: Normal respiratory effort without tachypnea nor retractions. Breath sounds are clear and equal bilaterally. No wheezes/rales/rhonchi. Gastrointestinal: Soft and tender to palpation in the left lower quadrant.  Genitourinary: Deferred Musculoskeletal: Normal range of motion in all extremities. No lower extremity edema. Neurologic:  Normal speech and language. No gross focal neurologic deficits are appreciated.  Skin:  Skin is warm, dry and intact. No rash noted. Psychiatric: Mood and affect are normal. Speech and behavior are normal.  Patient exhibits appropriate insight and judgment.  ____________________________________________    LABS (pertinent positives/negatives)  Upreg negative Lipase 35 CMP wnl except na 134, t pro 8.3 CBC wbc 12.6, hgb 12.1, plt 358 UA clear, 0-5 rbc and wbc  ____________________________________________   EKG  None  ____________________________________________    RADIOLOGY  CT abd/pel Persistent findings of diverticulitis without perforation or abscess  ____________________________________________   PROCEDURES  Procedures  ____________________________________________   INITIAL IMPRESSION / ASSESSMENT AND PLAN / ED COURSE  Pertinent labs & imaging results that were available during my care of the patient were reviewed by me and considered in my medical decision making (see chart for details).   Patient presented to the emergency department today because of concerns for continued left lower quadrant pain. Patient was seen in the emergency department and started on oral antibiotics for diverticulitis.  Repeat CT scan today does not show any abscess or perforation.  imaging findings concerning for diverticulitis. However given continued abdominal pain, leukocytosis and imaging finding will plan on admission for IV antibiotics.  Discussed findings plan with patient.  ____________________________________________   FINAL CLINICAL IMPRESSION(S) / ED DIAGNOSES  Final diagnoses:  Diverticulitis     Note: This dictation was prepared with Dragon dictation. Any transcriptional errors that result from this process are unintentional     Phineas Semen, MD 03/04/20 [id number]

## 2020-03-04 NOTE — ED Notes (Signed)
Patient ambulated to restroom without difficulty, provided with water and ginger ale

## 2020-03-04 NOTE — H&P (Signed)
Chief Complaint:  I have persitent LLQ abdominal pain HPI: Barbara Gregory is an 39 y.o. female with no significant past medical history who presented initially presented on 04/07 with complaints of abdominal pain.  Pain was located in the left lower abdominal quadrant.  Pain was rated as constant 6-7 over 10.  Onset of symptoms was 3 days prior to presentation.   CT scan on that admission confirmed findings suggesting acute diverticulitis.  Patient was subsequently discharged home on analgesics and antibiotics to be taken at home.  She returns to the emergency room 8 days after discharge with persistent left lower quadrant abdominal pain.  She has had poor oral intake over this course of treatment.  She denies any fever, chills, nausea or vomiting.  She admits to feeling constipated that is relieved with suppositories.  She denies any bleeding per rectum.    She admits compliance with antibiotics that she was prescribed with but reports worsening symptoms for the past 3 days.  Repeat CT scan was done on this admission shows persistent diverticulitis with no obvious signs of abscess or perforation   History reviewed. No pertinent past medical history.  Past Surgical History:  Procedure Laterality Date  . CHOLECYSTECTOMY    . TUBAL LIGATION      No family history on file. Social History:  reports that she has never smoked. She does not have any smokeless tobacco history on file. She reports that she does not drink alcohol. No history on file for drug.  Allergies: No Known Allergies  (Not in a hospital admission)   Results for orders placed or performed during the hospital encounter of 03/04/20 (from the past 48 hour(s))  Lipase, blood     Status: None   Collection Time: 03/04/20  1:18 PM  Result Value Ref Range   Lipase 35 11 - 51 U/L    Comment: Performed at [facility], [address] [place], [place] [address]  Comprehensive metabolic panel     Status: Abnormal   Collection  Time: 03/04/20  1:18 PM  Result Value Ref Range   Sodium 134 (L) 135 - 145 mmol/L   Potassium 4.1 3.5 - 5.1 mmol/L   Chloride 102 98 - 111 mmol/L   CO2 24 22 - 32 mmol/L   Glucose, Bld 95 70 - 99 mg/dL    Comment: Glucose reference range applies only to samples taken after fasting for at least 8 hours.   BUN 13 6 - 20 mg/dL   Creatinine, Ser 1.95 0.44 - 1.00 mg/dL   Calcium 9.0 8.9 - 09.3 mg/dL   Total Protein 8.3 (H) 6.5 - 8.1 g/dL   Albumin 4.0 3.5 - 5.0 g/dL   AST 21 15 - 41 U/L   ALT 27 0 - 44 U/L   Alkaline Phosphatase 95 38 - 126 U/L   Total Bilirubin 0.7 0.3 - 1.2 mg/dL   GFR calc non Af Amer >60 >60 mL/min   GFR calc Af Amer >60 >60 mL/min   Anion gap 8 5 - 15    Comment: Performed at [facility], [address] [place], [place] [address]  CBC     Status: Abnormal   Collection Time: 03/04/20  1:18 PM  Result Value Ref Range   WBC 12.6 (H) 4.0 - 10.5 K/uL   RBC 3.95 3.87 - 5.11 MIL/uL   Hemoglobin 12.1 12.0 - 15.0 g/dL   HCT 45.8 (L) 09.9 - 83.3 %   MCV 90.9 80.0 -  100.0 fL   MCH 30.6 26.0 - 34.0 pg   MCHC 33.7 30.0 - 36.0 g/dL   RDW 12.9 11.5 - 15.5 %   Platelets 358 150 - 400 K/uL   nRBC 0.0 0.0 - 0.2 %    Comment: Performed at [facility], [address] [place], [place] [address]  Urinalysis, Complete w Microscopic     Status: Abnormal   Collection Time: 03/04/20  1:18 PM  Result Value Ref Range   Color, Urine YELLOW (A) YELLOW   APPearance CLEAR (A) CLEAR   Specific Gravity, Urine 1.010 1.005 - 1.030   pH 5.0 5.0 - 8.0   Glucose, UA NEGATIVE NEGATIVE mg/dL   Hgb urine dipstick NEGATIVE NEGATIVE   Bilirubin Urine NEGATIVE NEGATIVE   Ketones, ur NEGATIVE NEGATIVE mg/dL   Protein, ur NEGATIVE NEGATIVE mg/dL   Nitrite NEGATIVE NEGATIVE   Leukocytes,Ua NEGATIVE NEGATIVE   RBC / HPF 0-5 0 - 5 RBC/hpf   WBC, UA 0-5 0 - 5 WBC/hpf   Bacteria, UA NONE SEEN NONE SEEN   Squamous Epithelial / LPF 0-5 0 - 5   Mucus PRESENT     Comment:  Performed at [facility], [address] [place], [place] [address]  Pregnancy, urine POC     Status: None   Collection Time: 03/04/20  1:25 PM  Result Value Ref Range   Preg Test, Ur NEGATIVE NEGATIVE    Comment:        THE SENSITIVITY OF THIS METHODOLOGY IS >24 mIU/mL    CT ABDOMEN PELVIS W CONTRAST  Result Date: 03/04/2020 CLINICAL DATA:  Left lower quadrant pain for 1 week, history of antibiotics for diverticulitis. EXAM: CT ABDOMEN AND PELVIS WITH CONTRAST TECHNIQUE: Multidetector CT imaging of the abdomen and pelvis was performed using the standard protocol following bolus administration of intravenous contrast. CONTRAST:  158mL OMNIPAQUE IOHEXOL 300 MG/ML  SOLN COMPARISON:  02/25/2020 FINDINGS: Lower chest: No acute abnormality. Hepatobiliary: Mild fatty infiltration of the liver is noted. The gallbladder has been surgically removed. Pancreas: Unremarkable. No pancreatic ductal dilatation or surrounding inflammatory changes. Spleen: Normal in size without focal abnormality. Adrenals/Urinary Tract: Adrenal glands are within normal limits. Kidneys demonstrate a normal enhancement pattern bilaterally. No renal calculi or urinary tract obstructive changes are seen. Normal excretion of contrast is noted on delayed images. The bladder is within normal limits. Stomach/Bowel: There again noted changes consistent with diverticulitis in the sigmoid colon. Considerable inflammatory changes noted which surround the left adnexa similar to that seen on the prior exam. No definitive abscess or perforation is identified. The more proximal colon is unremarkable. The appendix is not well visualized. No inflammatory changes to suggest appendicitis are noted. No small bowel abnormality is seen. The stomach is decompressed. Vascular/Lymphatic: No significant vascular findings are present. No enlarged abdominal or pelvic lymph nodes. Reproductive: Uterus and bilateral adnexa are unremarkable. Other: No  abdominal wall hernia or abnormality. No abdominopelvic ascites. Musculoskeletal: No acute or significant osseous findings. IMPRESSION: Persistent changes of diverticulitis with adjacent inflammatory change enveloping the left adnexa. No abscess or perforation is noted at this time. No other focal abnormality is noted. Electronically Signed   By: Inez Catalina M.D.   On: 03/04/2020 18:05    Review of Systems  All other systems reviewed and are negative.   Blood pressure 107/73, pulse 75, temperature 99.5 F (37.5 C), temperature source Oral, resp. rate 18, height 4\' 11"  (1.499 m), weight 73 kg, last menstrual period 02/15/2020, SpO2  100 %. Physical Exam  Constitutional: She is oriented to person, place, and time. She appears well-developed and well-nourished.  HENT:  Head: Normocephalic and atraumatic.  Cardiovascular: Normal rate, regular rhythm and normal heart sounds.  GI: Soft. Normal appearance. There is abdominal tenderness in the suprapubic area and left lower quadrant.  Musculoskeletal:        General: Normal range of motion.  Neurological: She is alert and oriented to person, place, and time.  Skin: Skin is warm.  Psychiatric: She has a normal mood and affect. Her behavior is normal.     Assessment/Plan #1. Acute Diverticulitis: Failed out patient treatment on PO antibiotics.  CT scan was negative for any evidence of abscess or perforation of viscus. Blood cultures pending.  Patient will be initiated on IV fluids and IV antibiotics.  General surgery input will be advised prior to discharge.  Analgesics for pain control.  Anticipate discharge in 1 to 2 days.  #2.  Mild leukocytosis most likely secondary to diverticulitis.  #3.  Mild hyponatremia-patient is currently on normal saline.  Follow-up with Chem-7 in a.m.   VT prophylaxis medication.  Lilia Pro, MD 03/04/2020, 7:57 PM

## 2020-03-04 NOTE — ED Triage Notes (Signed)
Pt c/o LLQ pain for greater than 1 week. Reports taking ABX for diverticulitis. Pt also c/o constipation. Pt alert and oriented X4, cooperative, RR even and unlabored, color WNL. Pt in NAD.

## 2020-03-04 NOTE — ED Notes (Signed)
Admitting provider at bedside.

## 2020-03-04 NOTE — ED Notes (Signed)
Pt transported to CT ?

## 2020-03-04 NOTE — ED Notes (Signed)
Patient is resting comfortably at this time

## 2020-03-05 ENCOUNTER — Encounter: Payer: Self-pay | Admitting: Internal Medicine

## 2020-03-05 DIAGNOSIS — K5792 Diverticulitis of intestine, part unspecified, without perforation or abscess without bleeding: Secondary | ICD-10-CM

## 2020-03-05 LAB — CBC
HCT: 33.8 % — ABNORMAL LOW (ref 36.0–46.0)
Hemoglobin: 11.2 g/dL — ABNORMAL LOW (ref 12.0–15.0)
MCH: 30.7 pg (ref 26.0–34.0)
MCHC: 33.1 g/dL (ref 30.0–36.0)
MCV: 92.6 fL (ref 80.0–100.0)
Platelets: 330 10*3/uL (ref 150–400)
RBC: 3.65 MIL/uL — ABNORMAL LOW (ref 3.87–5.11)
RDW: 12.8 % (ref 11.5–15.5)
WBC: 6.4 10*3/uL (ref 4.0–10.5)
nRBC: 0 % (ref 0.0–0.2)

## 2020-03-05 LAB — COMPREHENSIVE METABOLIC PANEL
ALT: 22 U/L (ref 0–44)
AST: 19 U/L (ref 15–41)
Albumin: 3.1 g/dL — ABNORMAL LOW (ref 3.5–5.0)
Alkaline Phosphatase: 77 U/L (ref 38–126)
Anion gap: 6 (ref 5–15)
BUN: 9 mg/dL (ref 6–20)
CO2: 26 mmol/L (ref 22–32)
Calcium: 8.3 mg/dL — ABNORMAL LOW (ref 8.9–10.3)
Chloride: 106 mmol/L (ref 98–111)
Creatinine, Ser: 0.58 mg/dL (ref 0.44–1.00)
GFR calc Af Amer: >60 mL/min (ref >60)
GFR calc non Af Amer: >60 mL/min (ref >60)
Glucose, Bld: 94 mg/dL (ref 70–99)
Potassium: 3.9 mmol/L (ref 3.5–5.1)
Sodium: 138 mmol/L (ref 135–145)
Total Bilirubin: 0.8 mg/dL (ref 0.3–1.2)
Total Protein: 6.9 g/dL (ref 6.5–8.1)

## 2020-03-05 LAB — HIV ANTIBODY (ROUTINE TESTING W REFLEX): HIV Screen 4th Generation wRfx: NONREACTIVE

## 2020-03-05 MED ORDER — PROCHLORPERAZINE EDISYLATE 10 MG/2ML IJ SOLN
10.0000 mg | Freq: Once | INTRAMUSCULAR | Status: AC
  Administered 2020-03-05: 22:00:00 10 mg via INTRAVENOUS
  Filled 2020-03-05 (×2): qty 2

## 2020-03-05 NOTE — Progress Notes (Signed)
Progress Note    Barbara Gregory  EHU:[id number] DOB: [birth year]  DOA: 03/04/2020 PCP: [facility]      Brief Narrative:    Medical records reviewed and are as summarized below:  Barbara Gregory is an 39 y.o. female with no significant past medical history who initially presented to the hospital on 02/25/2020 because of abdominal pain.  She was diagnosed with acute diverticulitis and she was prescribed oral Augmentin for treatment at that time.  She said she completed 7 days of Augmentin but she did not see any improvement.  She presented again to the hospital on 03/04/2020 with left lower quadrant abdominal pain and CT abdomen pelvis revealed persistent diverticulitis.      Assessment/Plan:   Active Problems:   Acute diverticulitis   Acute diverticulitis: Continue clear liquid diet.  Continue IV ciprofloxacin and Flagyl.  Analgesics as needed for pain.  Mild hyponatremia: Improved.  Discontinue IV fluids.  Body mass index is 32.51 kg/m.   Family Communication/Anticipated D/C date and plan/Code Status   DVT prophylaxis: Heparin Code Status: Full code Family Communication: Plan discussed with patient and her spouse at the bedside Disposition Plan:    Status is: Inpatient  Remains inpatient appropriate because:IV treatments appropriate due to intensity of illness or inability to take PO   Dispo: The patient is from: Home              Anticipated d/c is to: Home              Anticipated d/c date is: 2 days              Patient currently is not medically stable to d/c.            Subjective:   She complains of left-sided lower abdominal pain.  She vomited twice yesterday but none today.  She feels a little better today.  Objective:    Vitals:   03/04/20 2100 03/04/20 2230 03/04/20 2322 03/05/20 0927  BP: 112/78 99/64 120/78 91/60  Pulse: 70 78 75 68  Resp:   18 18  Temp:   98.1 F (36.7 C) 98.3 F (36.8 C)  TempSrc:   Oral  Oral  SpO2: 100% 98% 99% 97%  Weight:      Height:        Intake/Output Summary (Last 24 hours) at 03/05/2020 1222 Last data filed at 03/05/2020 7858 Gross per 24 hour  Intake 1440 ml  Output --  Net 1440 ml   Filed Weights   03/04/20 1317  Weight: 73 kg    Exam:  GEN: NAD SKIN: No rash EYES: EOMI ENT: MMM CV: RRR PULM: CTA B ABD: soft, ND, severe LLQ tenderness, no rebound tenderness or guarding, +BS CNS: AAO x 3, non focal EXT: No edema or tenderness   Data Reviewed:   I have personally reviewed following labs and imaging studies:  Labs: Labs show the following:   Basic Metabolic Panel: Recent Labs  Lab 03/04/20 1318 03/05/20 0440  NA 134* 138  K 4.1 3.9  CL 102 106  CO2 24 26  GLUCOSE 95 94  BUN 13 9  CREATININE 0.50 0.58  CALCIUM 9.0 8.3*   GFR Estimated Creatinine Clearance: 80.5 mL/min (by C-G formula based on SCr of 0.58 mg/dL). Liver Function Tests: Recent Labs  Lab 03/04/20 1318 03/05/20 0440  AST 21 19  ALT 27 22  ALKPHOS 95 77  BILITOT 0.7 0.8  PROT  8.3* 6.9  ALBUMIN 4.0 3.1*   Recent Labs  Lab 03/04/20 1318  LIPASE 35   No results for input(s): AMMONIA in the last 168 hours. Coagulation profile No results for input(s): INR, PROTIME in the last 168 hours.  CBC: Recent Labs  Lab 03/04/20 1318 03/05/20 0440  WBC 12.6* 6.4  HGB 12.1 11.2*  HCT 35.9* 33.8*  MCV 90.9 92.6  PLT 358 330   Cardiac Enzymes: No results for input(s): CKTOTAL, CKMB, CKMBINDEX, TROPONINI in the last 168 hours. BNP (last 3 results) No results for input(s): PROBNP in the last 8760 hours. CBG: No results for input(s): GLUCAP in the last 168 hours. D-Dimer: No results for input(s): DDIMER in the last 72 hours. Hgb A1c: No results for input(s): HGBA1C in the last 72 hours. Lipid Profile: No results for input(s): CHOL, HDL, LDLCALC, TRIG, CHOLHDL, LDLDIRECT in the last 72 hours. Thyroid function studies: No results for input(s): TSH, T4TOTAL,  T3FREE, THYROIDAB in the last 72 hours.  Invalid input(s): FREET3 Anemia work up: No results for input(s): VITAMINB12, FOLATE, FERRITIN, TIBC, IRON, RETICCTPCT in the last 72 hours. Sepsis Labs: Recent Labs  Lab 03/04/20 1318 03/05/20 0440  WBC 12.6* 6.4    Microbiology Recent Results (from the past 240 hour(s))  SARS CORONAVIRUS 2 (TAT 6-24 HRS) Nasopharyngeal Nasopharyngeal Swab     Status: None   Collection Time: 03/04/20  6:33 PM   Specimen: Nasopharyngeal Swab  Result Value Ref Range Status   SARS Coronavirus 2 NEGATIVE NEGATIVE Final    Comment: (NOTE) SARS-CoV-2 target nucleic acids are NOT DETECTED. The SARS-CoV-2 RNA is generally detectable in upper and lower respiratory specimens during the acute phase of infection. Negative results do not preclude SARS-CoV-2 infection, do not rule out co-infections with other pathogens, and should not be used as the sole basis for treatment or other patient management decisions. Negative results must be combined with clinical observations, patient history, and epidemiological information. The expected result is Negative. Fact Sheet for Patients: HairSlick.no Fact Sheet for Healthcare Providers: quierodirigir.com This test is not yet approved or cleared by the [place] FDA and  has been authorized for detection and/or diagnosis of SARS-CoV-2 by FDA under an Emergency Use Authorization (EUA). This EUA will remain  in effect (meaning this test can be used) for the duration of the COVID-19 declaration under Section 56 4(b)(1) of the Act, 21 U.S.C. section 360bbb-3(b)(1), unless the authorization is terminated or revoked sooner. Performed at [facility], [address], [place], [place] [address]     Procedures and diagnostic studies:  CT ABDOMEN PELVIS W CONTRAST  Result Date: 03/04/2020 CLINICAL DATA:  Left lower quadrant pain for 1 week, history of antibiotics  for diverticulitis. EXAM: CT ABDOMEN AND PELVIS WITH CONTRAST TECHNIQUE: Multidetector CT imaging of the abdomen and pelvis was performed using the standard protocol following bolus administration of intravenous contrast. CONTRAST:  OMNIPAQUE IOHEXOL 300 MG/ML  SOLN COMPARISON:  02/25/2020 FINDINGS: Lower chest: No acute abnormality. Hepatobiliary: Mild fatty infiltration of the liver is noted. The gallbladder has been surgically removed. Pancreas: Unremarkable. No pancreatic ductal dilatation or surrounding inflammatory changes. Spleen: Normal in size without focal abnormality. Adrenals/Urinary Tract: Adrenal glands are within normal limits. Kidneys demonstrate a normal enhancement pattern bilaterally. No renal calculi or urinary tract obstructive changes are seen. Normal excretion of contrast is noted on delayed images. The bladder is within normal limits. Stomach/Bowel: There again noted changes consistent with diverticulitis in the sigmoid colon. Considerable  inflammatory changes noted which surround the left adnexa similar to that seen on the prior exam. No definitive abscess or perforation is identified. The more proximal colon is unremarkable. The appendix is not well visualized. No inflammatory changes to suggest appendicitis are noted. No small bowel abnormality is seen. The stomach is decompressed. Vascular/Lymphatic: No significant vascular findings are present. No enlarged abdominal or pelvic lymph nodes. Reproductive: Uterus and bilateral adnexa are unremarkable. Other: No abdominal wall hernia or abnormality. No abdominopelvic ascites. Musculoskeletal: No acute or significant osseous findings. IMPRESSION: Persistent changes of diverticulitis with adjacent inflammatory change enveloping the left adnexa. No abscess or perforation is noted at this time. No other focal abnormality is noted. Electronically Signed   By: Inez Catalina M.D.   On: 03/04/2020 18:05    Medications:   . ARIPiprazole  2  mg Oral Q1500  . heparin  5,000 Units Subcutaneous Q8H  . sertraline  150 mg Oral Q1500   Continuous Infusions: . ciprofloxacin Stopped (03/05/20 0559)  . metronidazole 500 mg (03/05/20 0903)     LOS: 1 day   Keysean Savino  [facility]     03/05/2020, 12:22 PM

## 2020-03-06 NOTE — Progress Notes (Addendum)
Progress Note    Barbara Gregory  NTI:[id number] DOB: 10/20/1981  DOA: 03/04/2020 PCP: [facility]      Brief Narrative:    Medical records reviewed and are as summarized below:  Barbara Gregory is an 39 y.o. female with no significant past medical history who initially presented to the hospital on 02/25/2020 because of abdominal pain.  She was diagnosed with acute diverticulitis and she was prescribed oral Augmentin for treatment at that time.  She said she completed 7 days of Augmentin but she did not see any improvement.  She presented again to the hospital on 03/04/2020 with left lower quadrant abdominal pain and CT abdomen pelvis revealed persistent diverticulitis.      Assessment/Plan:   Active Problems:   Acute diverticulitis   Acute diverticulitis: Continue clear liquid diet.  Continue IV ciprofloxacin and Flagyl.  Analgesics as needed for pain.  She failed outpatient treatment with Augmentin.  Mild hyponatremia: Improved.    Body mass index is 32.51 kg/m.  (Morbid obesity)   Family Communication/Anticipated D/C date and plan/Code Status   DVT prophylaxis: Heparin Code Status: Full code Family Communication: Plan discussed with patient  Disposition Plan:    Status is: Inpatient  Remains inpatient appropriate because:IV treatments appropriate due to intensity of illness or inability to take PO   Dispo: The patient is from: Home              Anticipated d/c is to: Home              Anticipated d/c date is: 2 days              Patient currently is not medically stable to d/c.            Subjective:   She said she vomited about 3 times last night.  She still has left-sided abdominal pain.  No vomiting today.  No diarrhea.  She is tolerating clear liquid diet.  Objective:    Vitals:   03/05/20 0927 03/05/20 1621 03/06/20 0019 03/06/20 0810  BP: 91/60 (!) 89/48 106/64 98/68  Pulse: 68 65 63 68  Resp: 18 16 15 18   Temp:  98.3 F (36.8 C) 98.1 F (36.7 C) 97.8 F (36.6 C) 97.9 F (36.6 C)  TempSrc: Oral Oral Oral Oral  SpO2: 97% 98% 95% 100%  Weight:      Height:        Intake/Output Summary (Last 24 hours) at 03/06/2020 1558 Last data filed at 03/06/2020 0200 Gross per 24 hour  Intake 402.55 ml  Output --  Net 402.55 ml   Filed Weights   03/04/20 1317  Weight: 73 kg    Exam:  GEN: NAD SKIN: No rash EYES: EOMI ENT: MMM CV: RRR PULM: CTA B ABD: soft, ND, severe suprapubic and LLQ tenderness, no rebound tenderness or guarding, +BS CNS: AAO x 3, non focal EXT: No edema or tenderness   Data Reviewed:   I have personally reviewed following labs and imaging studies:  Labs: Labs show the following:   Basic Metabolic Panel: Recent Labs  Lab 03/04/20 1318 03/05/20 0440  NA 134* 138  K 4.1 3.9  CL 102 106  CO2 24 26  GLUCOSE 95 94  BUN 13 9  CREATININE 0.50 0.58  CALCIUM 9.0 8.3*   GFR Estimated Creatinine Clearance: 80.5 mL/min (by C-G formula based on SCr of 0.58 mg/dL). Liver Function Tests: Recent Labs  Lab 03/04/20 1318  03/05/20 0440  AST 21 19  ALT 27 22  ALKPHOS 95 77  BILITOT 0.7 0.8  PROT 8.3* 6.9  ALBUMIN 4.0 3.1*   Recent Labs  Lab 03/04/20 1318  LIPASE 35   No results for input(s): AMMONIA in the last 168 hours. Coagulation profile No results for input(s): INR, PROTIME in the last 168 hours.  CBC: Recent Labs  Lab 03/04/20 1318 03/05/20 0440  WBC 12.6* 6.4  HGB 12.1 11.2*  HCT 35.9* 33.8*  MCV 90.9 92.6  PLT 358 330   Cardiac Enzymes: No results for input(s): CKTOTAL, CKMB, CKMBINDEX, TROPONINI in the last 168 hours. BNP (last 3 results) No results for input(s): PROBNP in the last 8760 hours. CBG: No results for input(s): GLUCAP in the last 168 hours. D-Dimer: No results for input(s): DDIMER in the last 72 hours. Hgb A1c: No results for input(s): HGBA1C in the last 72 hours. Lipid Profile: No results for input(s): CHOL, HDL, LDLCALC,  TRIG, CHOLHDL, LDLDIRECT in the last 72 hours. Thyroid function studies: No results for input(s): TSH, T4TOTAL, T3FREE, THYROIDAB in the last 72 hours.  Invalid input(s): FREET3 Anemia work up: No results for input(s): VITAMINB12, FOLATE, FERRITIN, TIBC, IRON, RETICCTPCT in the last 72 hours. Sepsis Labs: Recent Labs  Lab 03/04/20 1318 03/05/20 0440  WBC 12.6* 6.4    Microbiology Recent Results (from the past 240 hour(s))  SARS CORONAVIRUS 2 (TAT 6-24 HRS) Nasopharyngeal Nasopharyngeal Swab     Status: None   Collection Time: 03/04/20  6:33 PM   Specimen: Nasopharyngeal Swab  Result Value Ref Range Status   SARS Coronavirus 2 NEGATIVE NEGATIVE Final    Comment: (NOTE) SARS-CoV-2 target nucleic acids are NOT DETECTED. The SARS-CoV-2 RNA is generally detectable in upper and lower respiratory specimens during the acute phase of infection. Negative results do not preclude SARS-CoV-2 infection, do not rule out co-infections with other pathogens, and should not be used as the sole basis for treatment or other patient management decisions. Negative results must be combined with clinical observations, patient history, and epidemiological information. The expected result is Negative. Fact Sheet for Patients: HairSlick.no Fact Sheet for Healthcare Providers: quierodirigir.com This test is not yet approved or cleared by the [place] FDA and  has been authorized for detection and/or diagnosis of SARS-CoV-2 by FDA under an Emergency Use Authorization (EUA). This EUA will remain  in effect (meaning this test can be used) for the duration of the COVID-19 declaration under Section 56 4(b)(1) of the Act, 21 U.S.C. section 360bbb-3(b)(1), unless the authorization is terminated or revoked sooner. Performed at [facility], [address], [place], [place] [address]     Procedures and diagnostic studies:  CT ABDOMEN PELVIS  W CONTRAST  Result Date: 03/04/2020 CLINICAL DATA:  Left lower quadrant pain for 1 week, history of antibiotics for diverticulitis. EXAM: CT ABDOMEN AND PELVIS WITH CONTRAST TECHNIQUE: Multidetector CT imaging of the abdomen and pelvis was performed using the standard protocol following bolus administration of intravenous contrast. CONTRAST:  OMNIPAQUE IOHEXOL 300 MG/ML  SOLN COMPARISON:  02/25/2020 FINDINGS: Lower chest: No acute abnormality. Hepatobiliary: Mild fatty infiltration of the liver is noted. The gallbladder has been surgically removed. Pancreas: Unremarkable. No pancreatic ductal dilatation or surrounding inflammatory changes. Spleen: Normal in size without focal abnormality. Adrenals/Urinary Tract: Adrenal glands are within normal limits. Kidneys demonstrate a normal enhancement pattern bilaterally. No renal calculi or urinary tract obstructive changes are seen. Normal excretion of contrast is noted on delayed  images. The bladder is within normal limits. Stomach/Bowel: There again noted changes consistent with diverticulitis in the sigmoid colon. Considerable inflammatory changes noted which surround the left adnexa similar to that seen on the prior exam. No definitive abscess or perforation is identified. The more proximal colon is unremarkable. The appendix is not well visualized. No inflammatory changes to suggest appendicitis are noted. No small bowel abnormality is seen. The stomach is decompressed. Vascular/Lymphatic: No significant vascular findings are present. No enlarged abdominal or pelvic lymph nodes. Reproductive: Uterus and bilateral adnexa are unremarkable. Other: No abdominal wall hernia or abnormality. No abdominopelvic ascites. Musculoskeletal: No acute or significant osseous findings. IMPRESSION: Persistent changes of diverticulitis with adjacent inflammatory change enveloping the left adnexa. No abscess or perforation is noted at this time. No other focal abnormality is  noted. Electronically Signed   By: Alcide Clever M.D.   On: 03/04/2020 18:05    Medications:   . ARIPiprazole  2 mg Oral Q1500  . heparin  5,000 Units Subcutaneous Q8H  . sertraline  150 mg Oral Q1500   Continuous Infusions: . ciprofloxacin 400 mg (03/06/20 0622)  . metronidazole 500 mg (03/06/20 1435)     LOS: 2 days   Daziah Hesler  [facility]     03/06/2020, 3:58 PM

## 2020-03-07 MED ORDER — SODIUM CHLORIDE 0.9% FLUSH: 10.0000 mL | INTRAVENOUS | Status: DC | PRN

## 2020-03-07 NOTE — Plan of Care (Signed)
  Problem: Education: Goal: Knowledge of General Education information will improve Description: Including pain rating scale, medication(s)/side effects and non-pharmacologic comfort measures Outcome: Progressing   Problem: Clinical Measurements: Goal: Will remain free from infection Outcome: Progressing Goal: Diagnostic test results will improve Outcome: Progressing Goal: Respiratory complications will improve Outcome: Progressing   Problem: Pain Managment: Goal: General experience of comfort will improve Outcome: Progressing  

## 2020-03-07 NOTE — Progress Notes (Signed)
Pt states that she feels Cipro may be causing feelings of nausea and occasional vomiting. Discussed plan of care with pt. Pt agreeable to continue prescribed antibiotic regimen and  Administration of PRN antiemetic prior to hanging next due dose of Cipro.

## 2020-03-07 NOTE — Progress Notes (Signed)
Progress Note    Barbara Gregory  MEQ:[id number] DOB: 1981/07/18  DOA: 03/04/2020 [facility]CP: [facility]      Brief Narrative:    Medical records reviewed and are as summarized below:  Barbara Gregory is an 39 y.o. female with no significant past medical history who initially presented to the hospital on 02/25/2020 because of abdominal pain.  She was diagnosed with acute diverticulitis and she was prescribed oral Augmentin for treatment at that time.  She said she completed 7 days of Augmentin but she did not see any improvement.  She presented again to the hospital on 03/04/2020 with left lower quadrant abdominal pain and CT abdomen pelvis revealed persistent diverticulitis.      Assessment/Plan:   Active Problems:   Acute diverticulitis   Acute diverticulitis: Advance diet to regular diet if she tolerates a full liquid diet.  Continue IV ciprofloxacin and Flagyl for another day.  Analgesics as needed for pain.  She failed outpatient treatment with Augmentin.  Mild hyponatremia: Improved.    Body mass index is 32.51 kg/m.  (Morbid obesity)   Family Communication/Anticipated D/C date and plan/Code Status   DVT prophylaxis: Heparin Code Status: Full code Family Communication: Plan discussed with patient  Disposition Plan:    Status is: Inpatient  Remains inpatient appropriate because:IV treatments appropriate due to intensity of illness or inability to take PO   Dispo: The patient is from: Home              Anticipated d/c is to: Home              Anticipated d/c date is: 1 day              Patient currently is not medically stable to d/c.            Subjective:   She thinks the Ciprofloxacin infusion may be the cause of her nausea.  She tolerated clear liquid diet this morning.  Abdominal pain is under better today.  Objective:    Vitals:   03/06/20 0019 03/06/20 0810 03/07/20 0011 03/07/20 1435  BP: 106/64 98/68 102/65 99/65    Pulse: 63 68 68 79  Resp: 15 18 15 18   Temp: 97.8 F (36.6 C) 97.9 F (36.6 C) 98.2 F (36.8 C) 98.4 F (36.9 C)  TempSrc: Oral Oral Oral Oral  SpO2: 95% 100% 99% 99%  Weight:      Height:        Intake/Output Summary (Last 24 hours) at 03/07/2020 1530 Last data filed at 03/07/2020 1400 Gross per 24 hour  Intake 998.37 ml  Output --  Net 998.37 ml   Filed Weights   03/04/20 1317  Weight: 73 kg    Exam:  GEN: NAD SKIN: No rash EYES: EOMI ENT: MMM CV: RRR PULM: CTA B ABD: soft, ND, less severe suprapubic and LLQ tenderness, no rebound tenderness or guarding, +BS CNS: AAO x 3, non focal EXT: No edema or tenderness   Data Reviewed:   I have personally reviewed following labs and imaging studies:  Labs: Labs show the following:   Basic Metabolic Panel: Recent Labs  Lab 03/04/20 1318 03/05/20 0440  NA 134* 138  K 4.1 3.9  CL 102 106  CO2 24 26  GLUCOSE 95 94  BUN 13 9  CREATININE 0.50 0.58  CALCIUM 9.0 8.3*   GFR Estimated Creatinine Clearance: 80.5 mL/min (by C-G formula based on SCr of 0.58 mg/dL).  Liver Function Tests: Recent Labs  Lab 03/04/20 1318 03/05/20 0440  AST 21 19  ALT 27 22  ALKPHOS 95 77  BILITOT 0.7 0.8  PROT 8.3* 6.9  ALBUMIN 4.0 3.1*   Recent Labs  Lab 03/04/20 1318  LIPASE 35   No results for input(s): AMMONIA in the last 168 hours. Coagulation profile No results for input(s): INR, PROTIME in the last 168 hours.  CBC: Recent Labs  Lab 03/04/20 1318 03/05/20 0440  WBC 12.6* 6.4  HGB 12.1 11.2*  HCT 35.9* 33.8*  MCV 90.9 92.6  PLT 358 330   Cardiac Enzymes: No results for input(s): CKTOTAL, CKMB, CKMBINDEX, TROPONINI in the last 168 hours. BNP (last 3 results) No results for input(s): PROBNP in the last 8760 hours. CBG: No results for input(s): GLUCAP in the last 168 hours. D-Dimer: No results for input(s): DDIMER in the last 72 hours. Hgb A1c: No results for input(s): HGBA1C in the last 72 hours. Lipid  Profile: No results for input(s): CHOL, HDL, LDLCALC, TRIG, CHOLHDL, LDLDIRECT in the last 72 hours. Thyroid function studies: No results for input(s): TSH, T4TOTAL, T3FREE, THYROIDAB in the last 72 hours.  Invalid input(s): FREET3 Anemia work up: No results for input(s): VITAMINB12, FOLATE, FERRITIN, TIBC, IRON, RETICCTPCT in the last 72 hours. Sepsis Labs: Recent Labs  Lab 03/04/20 1318 03/05/20 0440  WBC 12.6* 6.4    Microbiology Recent Results (from the past 240 hour(s))  SARS CORONAVIRUS 2 (TAT 6-24 HRS) Nasopharyngeal Nasopharyngeal Swab     Status: None   Collection Time: 03/04/20  6:33 PM   Specimen: Nasopharyngeal Swab  Result Value Ref Range Status   SARS Coronavirus 2 NEGATIVE NEGATIVE Final    Comment: (NOTE) SARS-CoV-2 target nucleic acids are NOT DETECTED. The SARS-CoV-2 RNA is generally detectable in upper and lower respiratory specimens during the acute phase of infection. Negative results do not preclude SARS-CoV-2 infection, do not rule out co-infections with other pathogens, and should not be used as the sole basis for treatment or other patient management decisions. Negative results must be combined with clinical observations, patient history, and epidemiological information. The expected result is Negative. Fact Sheet for Patients: SugarRoll.be Fact Sheet for Healthcare Providers: https://www.woods-mathews.com/ This test is not yet approved or cleared by the [place] FDA and  has been authorized for detection and/or diagnosis of SARS-CoV-2 by FDA under an Emergency Use Authorization (EUA). This EUA will remain  in effect (meaning this test can be used) for the duration of the COVID-19 declaration under Section 56 4(b)(1) of the Act, 21 U.S.C. section 360bbb-3(b)(1), unless the authorization is terminated or revoked sooner. Performed at [facility], [place] [address] [place], [place] [address]      Procedures and diagnostic studies:  No results found.  Medications:   . ARIPiprazole  2 mg Oral Q1500  . heparin  5,000 Units Subcutaneous Q8H  . sertraline  150 mg Oral Q1500   Continuous Infusions: . ciprofloxacin 400 mg (03/07/20 0520)  . metronidazole 500 mg (03/07/20 1126)     LOS: 3 days   Sahasra Belue  [facility]     03/07/2020, 3:30 PM

## 2020-03-08 MED ORDER — METRONIDAZOLE 500 MG PO TABS: 500.0000 mg | ORAL_TABLET | Freq: Three times a day (TID) | ORAL | 0 refills | Status: AC

## 2020-03-08 MED ORDER — CIPROFLOXACIN HCL 500 MG PO TABS: 500.0000 mg | ORAL_TABLET | Freq: Two times a day (BID) | ORAL | 0 refills | Status: AC

## 2020-03-08 NOTE — TOC Initial Note (Signed)
Transition of Care [facility]) - Initial/Assessment Note    Patient Details  Name: Barbara Gregory MRN: [id number] Date of Birth: Nov 14, 1981  Transition of Care [facility]) CM/SW Contact:    Allayne Butcher, RN Phone Number: 03/08/2020, 3:40 PM  Clinical Narrative:                 RNCM received consult for financial assistance.  Patient is from home with her husband, is independent in ADL's, and works at [employer].  Patient is established with the [facility], she reports getting some prescriptions from there or going to [place].  Patient does not currently have insurance.  RNCM provided patient with a printout for [facility] Financial assistance - there is a website to apply and a customer service number.  Patient was seen by financial counselor earlier today and also provided with information.  Patient denies any other needs.   Expected Discharge Plan: Home/Self Care Barriers to Discharge: Continued Medical Work up   Patient Goals and CMS Choice        Expected Discharge Plan and Services Expected Discharge Plan: Home/Self Care In-house Referral: Financial Counselor Discharge Planning Services: CM Consult   Living arrangements for the past 2 months: Single Family Home Expected Discharge Date: 03/08/20                                    Prior Living Arrangements/Services Living arrangements for the past 2 months: Single Family Home Lives with:: Spouse Patient language and need for interpreter reviewed:: Yes Do you feel safe going back to the place where you live?: Yes      Need for Family Participation in Patient Care: Yes (Comment)(diverticulitis) Care giver support system in place?: Yes (comment)(husband)   Criminal Activity/Legal Involvement Pertinent to Current Situation/Hospitalization: No - Comment as needed  Activities of Daily Living Home Assistive Devices/Equipment: None ADL Screening (condition at time of admission) Patient's cognitive ability adequate to safely  complete daily activities?: Yes Is the patient deaf or have difficulty hearing?: No Does the patient have difficulty seeing, even when wearing glasses/contacts?: No Does the patient have difficulty concentrating, remembering, or making decisions?: No Patient able to express need for assistance with ADLs?: Yes Does the patient have difficulty dressing or bathing?: No Independently performs ADLs?: Yes (appropriate for developmental age) Does the patient have difficulty walking or climbing stairs?: No Weakness of Legs: None Weakness of Arms/Hands: None  Permission Sought/Granted Permission sought to share information with : Case Manager, Family Supports Permission granted to share information with : Yes, Verbal Permission Granted  Share Information with NAME: Luisa Hart     Permission granted to share info w Relationship: Husband     Emotional Assessment Appearance:: Appears younger than stated age Attitude/Demeanor/Rapport: Engaged Affect (typically observed): Accepting Orientation: : Oriented to Self, Oriented to Place, Oriented to  Time, Oriented to Situation Alcohol / Substance Use: Not Applicable Psych Involvement: No (comment)  Admission diagnosis:  Diverticulitis [K57.92] Acute diverticulitis [K57.92] Patient Active Problem List   Diagnosis Date Noted  . Acute diverticulitis 03/04/2020   PCP:  [facility]:   [facility] [address]), [place] - [address] [place]) [place] [address] Phone: [phone] Fax: [phone]     Social Determinants of Health (SDOH) Interventions    Readmission Risk Interventions No flowsheet data found.

## 2020-03-08 NOTE — Progress Notes (Signed)
Patient called RN into room and said that she was feeling very nauseous and had a headache. Tyelnol given and a cool rag placed on the back of patient's neck for relief of nausea. Dr. Myriam Forehand alerted who cancelled patient's discharge orders. Patient vomited around 1500. Patient states she feels better after vomiting. Will continue to monitor.

## 2020-03-08 NOTE — Progress Notes (Signed)
Progress Note    Barbara Gregory  FIE:[id number] DOB: [birth year]  DOA: 03/04/2020 PCP: [facility]      Brief Narrative:    Medical records reviewed and are as summarized below:  Barbara Gregory is an 39 y.o. female with no significant past medical history who initially presented to the hospital on 02/25/2020 because of abdominal pain.  She was diagnosed with acute diverticulitis and she was prescribed oral Augmentin for treatment at that time.  She said she completed 7 days of Augmentin but she did not see any improvement.  She presented again to the hospital on 03/04/2020 with left lower quadrant abdominal pain and CT abdomen pelvis revealed persistent diverticulitis.      Assessment/Plan:   Active Problems:   Acute diverticulitis   Acute diverticulitis: Patient could not tolerate regular diet.  Plan for discharge was canceled as a result of this.  Change regular diet to liquid diet.  Continue IV ciprofloxacin and Flagyl.  Analgesics as needed for pain.  She failed outpatient treatment with Augmentin.  Mild hyponatremia: Improved.    Body mass index is 32.51 kg/m.  (Morbid obesity)   Family Communication/Anticipated D/C date and plan/Code Status   DVT prophylaxis: Heparin Code Status: Full code Family Communication: Plan discussed with patient  Disposition Plan:    Status is: Inpatient  Remains inpatient appropriate because:IV treatments appropriate due to intensity of illness or inability to take PO   Dispo: The patient is from: Home              Anticipated d/c is to: Home              Anticipated d/c date is: 1 day              Patient currently is not medically stable to d/c.            Subjective:   She had nausea and vomiting after breakfast this morning.  She thought she was going to do better with lunch.  Unfortunately, she developed nausea  headache after lunch.  She vomited a few hours later.   Objective:     Vitals:   03/07/20 1435 03/08/20 0006 03/08/20 0732 03/08/20 1734  BP: 99/65 97/64 96/61  106/72  Pulse: 79 67 67 70  Resp: 18 18 17 16   Temp: 98.4 F (36.9 C) 98 F (36.7 C) 98.1 F (36.7 C) 98 F (36.7 C)  TempSrc: Oral Oral Oral Oral  SpO2: 99% 98% 98% 99%  Weight:      Height:        Intake/Output Summary (Last 24 hours) at 03/08/2020 2130 Last data filed at 03/08/2020 1907 Gross per 24 hour  Intake 1838.17 ml  Output --  Net 1838.17 ml   Filed Weights   03/04/20 1317  Weight: 73 kg    Exam:  GEN: NAD SKIN: No rash EYES: EOMI ENT: MMM CV: RRR PULM: CTA B ABD: soft, ND, mild suprapubic and LLQ tenderness, no rebound tenderness or guarding, +BS CNS: AAO x 3, non focal EXT: No edema or tenderness   Data Reviewed:   I have personally reviewed following labs and imaging studies:  Labs: Labs show the following:   Basic Metabolic Panel: Recent Labs  Lab 03/04/20 1318 03/05/20 0440  NA 134* 138  K 4.1 3.9  CL 102 106  CO2 24 26  GLUCOSE 95 94  BUN 13 9  CREATININE 0.50 0.58  CALCIUM 9.0 8.3*  GFR Estimated Creatinine Clearance: 80.5 mL/min (by C-G formula based on SCr of 0.58 mg/dL). Liver Function Tests: Recent Labs  Lab 03/04/20 1318 03/05/20 0440  AST 21 19  ALT 27 22  ALKPHOS 95 77  BILITOT 0.7 0.8  PROT 8.3* 6.9  ALBUMIN 4.0 3.1*   Recent Labs  Lab 03/04/20 1318  LIPASE 35   No results for input(s): AMMONIA in the last 168 hours. Coagulation profile No results for input(s): INR, PROTIME in the last 168 hours.  CBC: Recent Labs  Lab 03/04/20 1318 03/05/20 0440  WBC 12.6* 6.4  HGB 12.1 11.2*  HCT 35.9* 33.8*  MCV 90.9 92.6  PLT 358 330   Cardiac Enzymes: No results for input(s): CKTOTAL, CKMB, CKMBINDEX, TROPONINI in the last 168 hours. BNP (last 3 results) No results for input(s): PROBNP in the last 8760 hours. CBG: No results for input(s): GLUCAP in the last 168 hours. D-Dimer: No results for input(s): DDIMER  in the last 72 hours. Hgb A1c: No results for input(s): HGBA1C in the last 72 hours. Lipid Profile: No results for input(s): CHOL, HDL, LDLCALC, TRIG, CHOLHDL, LDLDIRECT in the last 72 hours. Thyroid function studies: No results for input(s): TSH, T4TOTAL, T3FREE, THYROIDAB in the last 72 hours.  Invalid input(s): FREET3 Anemia work up: No results for input(s): VITAMINB12, FOLATE, FERRITIN, TIBC, IRON, RETICCTPCT in the last 72 hours. Sepsis Labs: Recent Labs  Lab 03/04/20 1318 03/05/20 0440  WBC 12.6* 6.4    Microbiology Recent Results (from the past 240 hour(s))  SARS CORONAVIRUS 2 (TAT 6-24 HRS) Nasopharyngeal Nasopharyngeal Swab     Status: None   Collection Time: 03/04/20  6:33 PM   Specimen: Nasopharyngeal Swab  Result Value Ref Range Status   SARS Coronavirus 2 NEGATIVE NEGATIVE Final    Comment: (NOTE) SARS-CoV-2 target nucleic acids are NOT DETECTED. The SARS-CoV-2 RNA is generally detectable in upper and lower respiratory specimens during the acute phase of infection. Negative results do not preclude SARS-CoV-2 infection, do not rule out co-infections with other pathogens, and should not be used as the sole basis for treatment or other patient management decisions. Negative results must be combined with clinical observations, patient history, and epidemiological information. The expected result is Negative. Fact Sheet for Patients: SugarRoll.be Fact Sheet for Healthcare Providers: https://www.woods-mathews.com/ This test is not yet approved or cleared by the [place] FDA and  has been authorized for detection and/or diagnosis of SARS-CoV-2 by FDA under an Emergency Use Authorization (EUA). This EUA will remain  in effect (meaning this test can be used) for the duration of the COVID-19 declaration under Section 56 4(b)(1) of the Act, 21 U.S.C. section 360bbb-3(b)(1), unless the authorization is terminated or revoked  sooner. Performed at [facility], [place] [address], [place], [place] [address]     Procedures and diagnostic studies:  No results found.  Medications:   . ARIPiprazole  2 mg Oral Q1500  . heparin  5,000 Units Subcutaneous Q8H  . sertraline  150 mg Oral Q1500   Continuous Infusions: . ciprofloxacin 400 mg (03/08/20 1800)  . metronidazole 500 mg (03/08/20 2027)     LOS: 4 days   Allysa Governale  [facility]     03/08/2020, 9:30 PM

## 2020-03-08 NOTE — Discharge Summary (Incomplete)
? ?  Physician Discharge Summary  ?Barbara Gregory UQJ:[id number] DOB: Apr 03, 1981 DOA: 03/04/2020 ? ?PCP: [facility] ? ?Admit date: 03/04/2020 ?Discharge date: 03/08/2020 ? ?Discharge disposition: *** ? ? ?Recommendations for Outpatient Follow-Up:  ? ?1. *** (include homehealth, outpatient follow-up instructions, specific recommendations for PCP to follow-up on, etc.) ?2. LACE score = ***, corresponding to a *** % of re-admission within 30 days.   (http://tools.farmacologiaclinica.info/index.php?sid=10044) ? ? ?Discharge Diagnosis:  ? ?Active Problems: ?  Acute diverticulitis ? ? ? ?Discharge Condition: Stable. ? ?Diet recommendation: *** ? ?Code status: ***. ? ? ? ?Hospital Course:  ? ?*** ? ? ? ?Medical Consultants:  ? ?? *** ? ? ?Discharge Exam:  ? ?Vitals:  ? 03/08/20 0006 03/08/20 0732  ?BP: 97/64 96/61  ?Pulse: 67 67  ?Resp: 18 17  ?Temp: 98 ?F (36.7 ?C) 98.1 ?F (36.7 ?C)  ?SpO2: 98% 98%  ? ?Vitals:  ? 03/07/20 0011 03/07/20 1435 03/08/20 0006 03/08/20 0732  ?BP: 102/65 99/65 97/64  96/61  ?Pulse: 68 79 67 67  ?Resp: 15 18 18 17   ?Temp: 98.2 ?F (36.8 ?C) 98.4 ?F (36.9 ?C) 98 ?F (36.7 ?C) 98.1 ?F (36.7 ?C)  ?TempSrc: Oral Oral Oral Oral  ?SpO2: 99% 99% 98% 98%  ?Weight:      ?Height:      ? ? ? ?GEN: NAD ?SKIN: No rash*** ?EYES: EOMI*** ?ENT: MMM ?CV: RRR ?PULM: CTA B ?ABD: soft, ND, NT, +BS ?CNS: AAO x 3, non focal ?EXT: No edema or tenderness*** ? ? ?The results of significant diagnostics from this hospitalization (including imaging, microbiology, ancillary and laboratory) are listed below for reference.   ? ? ?Procedures and Diagnostic Studies:  ? ?CT ABDOMEN PELVIS W CONTRAST ? ?Result Date: 03/04/2020 ? CLINICAL DATA:  Left lower quadrant pain for 1 week, history of antibiotics for diverticulitis. EXAM: CT ABDOMEN AND PELVIS WITH CONTRAST TECHNIQUE: Multidetector CT imaging of the abdomen and pelvis was performed using the standard protocol following bolus administration of intravenous contrast. CONTRAST:  03/06/2020 OMNIPAQUE IOHEXOL 300 MG/ML

## 2020-03-09 LAB — CBC WITH DIFFERENTIAL/PLATELET
Abs Immature Granulocytes: 0.04 10*3/uL (ref 0.00–0.07)
Basophils Absolute: 0.1 10*3/uL (ref 0.0–0.1)
Basophils Relative: 1 %
Eosinophils Absolute: 0.3 10*3/uL (ref 0.0–0.5)
Eosinophils Relative: 4 %
HCT: 36.9 % (ref 36.0–46.0)
Hemoglobin: 12.6 g/dL (ref 12.0–15.0)
Immature Granulocytes: 1 %
Lymphocytes Relative: 34 %
Lymphs Abs: 2.6 10*3/uL (ref 0.7–4.0)
MCH: 30.5 pg (ref 26.0–34.0)
MCHC: 34.1 g/dL (ref 30.0–36.0)
MCV: 89.3 fL (ref 80.0–100.0)
Monocytes Absolute: 0.6 10*3/uL (ref 0.1–1.0)
Monocytes Relative: 7 %
Neutro Abs: 4.2 10*3/uL (ref 1.7–7.7)
Neutrophils Relative %: 53 %
Platelets: 390 10*3/uL (ref 150–400)
RBC: 4.13 MIL/uL (ref 3.87–5.11)
RDW: 12.7 % (ref 11.5–15.5)
WBC: 7.7 10*3/uL (ref 4.0–10.5)
nRBC: 0 % (ref 0.0–0.2)

## 2020-03-09 LAB — BASIC METABOLIC PANEL
Anion gap: 8 (ref 5–15)
BUN: 11 mg/dL (ref 6–20)
CO2: 25 mmol/L (ref 22–32)
Calcium: 8.5 mg/dL — ABNORMAL LOW (ref 8.9–10.3)
Chloride: 104 mmol/L (ref 98–111)
Creatinine, Ser: 0.76 mg/dL (ref 0.44–1.00)
GFR calc Af Amer: >60 mL/min (ref >60)
GFR calc non Af Amer: >60 mL/min (ref >60)
Glucose, Bld: 96 mg/dL (ref 70–99)
Potassium: 3.8 mmol/L (ref 3.5–5.1)
Sodium: 137 mmol/L (ref 135–145)

## 2020-03-09 LAB — MAGNESIUM: Magnesium: 2.4 mg/dL (ref 1.7–2.4)

## 2020-03-09 NOTE — Discharge Summary (Signed)
Physician Discharge Summary  [name]:[id number] DOB: November 08, 1981 DOA: 03/04/2020  PCP: [facility]  Admit date: 03/04/2020 Discharge date: 03/09/2020  Discharge disposition: Home   Recommendations for Outpatient Follow-Up:   Outpatient follow-up with PCP in 1 to 2 weeks Follow-up colonoscopy in 5 to 6 weeks to rule out malignancy was recommended   Discharge Diagnosis:   Active Problems:   Acute diverticulitis    Discharge Condition: Stable.  Diet recommendation: Regular diet as tolerated  Code status: Full code.    Hospital Course:   [name] is a 39 y.o. female with no significant past medical history who initially presented to the hospital on 02/25/2020 because of abdominal pain.  She was diagnosed with acute diverticulitis and she was prescribed oral Augmentin for treatment at that time.  She said she completed 7 days of Augmentin but she did not see any improvement.  She presented again to the hospital on 03/04/2020 with left lower quadrant abdominal pain and CT abdomen pelvis revealed persistent diverticulitis.  She was admitted to the hospital for acute diverticulitis.  She was treated with empiric IV ciprofloxacin and metronidazole.  She was made NPO and was treated with IV fluids.  Her symptoms slowly improved.  Eventually, she was able to tolerate a regular diet without any vomiting or worsening abdominal pain.  She said she felt well enough to go home.  She is deemed stable for discharge to home.  The importance of establishing care with a primary care physician and following up with a gastroenterologist for colonoscopy was reiterated.      Discharge Exam:   Vitals:   03/09/20 0851 03/09/20 1511  BP: 116/79 120/88  Pulse: 77 87  Resp: 16 16  Temp: 98.2 F (36.8 C) 98.4 F (36.9 C)  SpO2: 98% 99%   Vitals:   03/08/20 1734 03/09/20 0058 03/09/20 0851 03/09/20 1511  BP: 106/72 92/64 116/79 120/88  Pulse: 70 73 77 87   Resp: 16 20 16 16   Temp: 98 F (36.7 C) 98.5 F (36.9 C) 98.2 F (36.8 C) 98.4 F (36.9 C)  TempSrc: Oral Oral Oral Oral  SpO2: 99% 99% 98% 99%  Weight:      Height:         GEN: NAD SKIN: No rash EYES: EOMI ENT: MMM CV: RRR PULM: CTA B ABD: soft, ND, mild tenderness LLQ, no rebound tenderness or guarding, +BS CNS: AAO x 3, non focal EXT: No edema or tenderness   The results of significant diagnostics from this hospitalization (including imaging, microbiology, ancillary and laboratory) are listed below for reference.     Procedures and Diagnostic Studies:   CT ABDOMEN PELVIS W CONTRAST  Result Date: 03/04/2020 CLINICAL DATA:  Left lower quadrant pain for 1 week, history of antibiotics for diverticulitis. EXAM: CT ABDOMEN AND PELVIS WITH CONTRAST TECHNIQUE: Multidetector CT imaging of the abdomen and pelvis was performed using the standard protocol following bolus administration of intravenous contrast. CONTRAST:  03/06/2020 OMNIPAQUE IOHEXOL 300 MG/ML  SOLN COMPARISON:  02/25/2020 FINDINGS: Lower chest: No acute abnormality. Hepatobiliary: Mild fatty infiltration of the liver is noted. The gallbladder has been surgically removed. Pancreas: Unremarkable. No pancreatic ductal dilatation or surrounding inflammatory changes. Spleen: Normal in size without focal abnormality. Adrenals/Urinary Tract: Adrenal glands are within normal limits. Kidneys demonstrate a normal enhancement pattern bilaterally. No renal calculi or urinary tract obstructive changes are seen. Normal excretion of contrast is noted on delayed images. The bladder  is within normal limits. Stomach/Bowel: There again noted changes consistent with diverticulitis in the sigmoid colon. Considerable inflammatory changes noted which surround the left adnexa similar to that seen on the prior exam. No definitive abscess or perforation is identified. The more proximal colon is unremarkable. The appendix is not well visualized. No  inflammatory changes to suggest appendicitis are noted. No small bowel abnormality is seen. The stomach is decompressed. Vascular/Lymphatic: No significant vascular findings are present. No enlarged abdominal or pelvic lymph nodes. Reproductive: Uterus and bilateral adnexa are unremarkable. Other: No abdominal wall hernia or abnormality. No abdominopelvic ascites. Musculoskeletal: No acute or significant osseous findings. IMPRESSION: Persistent changes of diverticulitis with adjacent inflammatory change enveloping the left adnexa. No abscess or perforation is noted at this time. No other focal abnormality is noted. Electronically Signed   By: Inez Catalina M.D.   On: 03/04/2020 18:05     Labs:   Basic Metabolic Panel: Recent Labs  Lab 03/04/20 1318 03/04/20 1318 03/05/20 0440 03/09/20 0542  NA 134*  --  138 137  K 4.1   < > 3.9 3.8  CL 102  --  106 104  CO2 24  --  26 25  GLUCOSE 95  --  94 96  BUN 13  --  9 11  CREATININE 0.50  --  0.58 0.76  CALCIUM 9.0  --  8.3* 8.5*  MG  --   --   --  2.4   < > = values in this interval not displayed.   GFR Estimated Creatinine Clearance: 80.5 mL/min (by C-G formula based on SCr of 0.76 mg/dL). Liver Function Tests: Recent Labs  Lab 03/04/20 1318 03/05/20 0440  AST 21 19  ALT 27 22  ALKPHOS 95 77  BILITOT 0.7 0.8  PROT 8.3* 6.9  ALBUMIN 4.0 3.1*   Recent Labs  Lab 03/04/20 1318  LIPASE 35   No results for input(s): AMMONIA in the last 168 hours. Coagulation profile No results for input(s): INR, PROTIME in the last 168 hours.  CBC: Recent Labs  Lab 03/04/20 1318 03/05/20 0440 03/09/20 0542  WBC 12.6* 6.4 7.7  NEUTROABS  --   --  4.2  HGB 12.1 11.2* 12.6  HCT 35.9* 33.8* 36.9  MCV 90.9 92.6 89.3  PLT 358 330 390   Cardiac Enzymes: No results for input(s): CKTOTAL, CKMB, CKMBINDEX, TROPONINI in the last 168 hours. BNP: Invalid input(s): POCBNP CBG: No results for input(s): GLUCAP in the last 168 hours. D-Dimer No  results for input(s): DDIMER in the last 72 hours. Hgb A1c No results for input(s): HGBA1C in the last 72 hours. Lipid Profile No results for input(s): CHOL, HDL, LDLCALC, TRIG, CHOLHDL, LDLDIRECT in the last 72 hours. Thyroid function studies No results for input(s): TSH, T4TOTAL, T3FREE, THYROIDAB in the last 72 hours.  Invalid input(s): FREET3 Anemia work up No results for input(s): VITAMINB12, FOLATE, FERRITIN, TIBC, IRON, RETICCTPCT in the last 72 hours. Microbiology Recent Results (from the past 240 hour(s))  SARS CORONAVIRUS 2 (TAT 6-24 HRS) Nasopharyngeal Nasopharyngeal Swab     Status: None   Collection Time: 03/04/20  6:33 PM   Specimen: Nasopharyngeal Swab  Result Value Ref Range Status   SARS Coronavirus 2 NEGATIVE NEGATIVE Final    Comment: (NOTE) SARS-CoV-2 target nucleic acids are NOT DETECTED. The SARS-CoV-2 RNA is generally detectable in upper and lower respiratory specimens during the acute phase of infection. Negative results do not preclude SARS-CoV-2 infection, do not rule out co-infections with  other pathogens, and should not be used as the sole basis for treatment or other patient management decisions. Negative results must be combined with clinical observations, patient history, and epidemiological information. The expected result is Negative. Fact Sheet for Patients: HairSlick.no Fact Sheet for Healthcare Providers: quierodirigir.com This test is not yet approved or cleared by the [place] FDA and  has been authorized for detection and/or diagnosis of SARS-CoV-2 by FDA under an Emergency Use Authorization (EUA). This EUA will remain  in effect (meaning this test can be used) for the duration of the COVID-19 declaration under Section 56 4(b)(1) of the Act, 21 U.S.C. section 360bbb-3(b)(1), unless the authorization is terminated or revoked sooner. Performed at [facility], [address], [place], [place] [address]      Discharge Instructions:   Discharge Instructions    Diet - low sodium heart healthy   Complete by: As directed    Discharge instructions   Complete by: As directed    Recommended referral (can be arranged by PCP) to gastroenterologist for colonoscopy to exclude colon mass or cancer.   Increase activity slowly   Complete by: As directed      Allergies as of 03/09/2020   No Known Allergies     Medication List    STOP taking these medications   amoxicillin-clavulanate 875-125 MG tablet Commonly known as: Augmentin   cyclobenzaprine 5 MG tablet Commonly known as: Flexeril   naproxen 500 MG EC tablet Commonly known as: EC NAPROSYN   ondansetron 4 MG disintegrating tablet Commonly known as: Zofran ODT     TAKE these medications   ARIPiprazole 2 MG tablet Commonly known as: ABILIFY Take 1 tablet by mouth daily in the afternoon.   ciprofloxacin 500 MG tablet Commonly known as: Cipro Take 1 tablet (500 mg total) by mouth 2 (two) times daily for 7 doses.   metroNIDAZOLE 500 MG tablet Commonly known as: Flagyl Take 1 tablet (500 mg total) by mouth 3 (three) times daily for 10 doses.   sertraline 100 MG tablet Commonly known as: ZOLOFT Take 150 mg by mouth daily in the afternoon.         Time coordinating discharge: 26 minutes  Signed:  Asa Baudoin  [facility] 03/09/2020, 3:45 PM

## 2020-03-10 ENCOUNTER — Ambulatory Visit: Payer: MEDICAID | Attending: Internal Medicine

## 2020-03-10 DIAGNOSIS — Z23 Encounter for immunization: Secondary | ICD-10-CM

## 2020-03-10 NOTE — Progress Notes (Signed)
   Covid-19 Vaccination Clinic  Name:  GETHSEMANE FISCHLER    MRN: [id number] DOB: 05/05/1981  03/10/2020  Ms. Art was observed post Covid-19 immunization for 15 minutes without incident. She was provided with Vaccine Information Sheet and instruction to access the V-Safe system.   Ms. Farinas was instructed to call 911 with any severe reactions post vaccine: Marland Kitchen Difficulty breathing  . Swelling of face and throat  . A fast heartbeat  . A bad rash all over body  . Dizziness and weakness   Immunizations Administered    Name Date Dose VIS Date Route   Pfizer COVID-19 Vaccine 03/10/2020 10:10 AM 0.3 mL 01/14/2019 Intramuscular   Manufacturer: [employer], [employer]   Lot: [id number]   NDC: [address]-8

## 2020-07-21 ENCOUNTER — Other Ambulatory Visit: Payer: Self-pay

## 2020-07-21 ENCOUNTER — Encounter: Payer: Self-pay | Admitting: Emergency Medicine

## 2020-07-21 DIAGNOSIS — R112 Nausea with vomiting, unspecified: Secondary | ICD-10-CM | POA: Insufficient documentation

## 2020-07-21 DIAGNOSIS — Z5321 Procedure and treatment not carried out due to patient leaving prior to being seen by health care provider: Secondary | ICD-10-CM | POA: Insufficient documentation

## 2020-07-21 DIAGNOSIS — R1010 Upper abdominal pain, unspecified: Secondary | ICD-10-CM | POA: Insufficient documentation

## 2020-07-21 LAB — CBC WITH DIFFERENTIAL/PLATELET
Abs Immature Granulocytes: 0.07 10*3/uL (ref 0.00–0.07)
Basophils Absolute: 0 10*3/uL (ref 0.0–0.1)
Basophils Relative: 0 %
Eosinophils Absolute: 0.1 10*3/uL (ref 0.0–0.5)
Eosinophils Relative: 0 %
HCT: 37.3 % (ref 36.0–46.0)
Hemoglobin: 12.2 g/dL (ref 12.0–15.0)
Immature Granulocytes: 0 %
Lymphocytes Relative: 15 %
Lymphs Abs: 2.5 10*3/uL (ref 0.7–4.0)
MCH: 29.3 pg (ref 26.0–34.0)
MCHC: 32.7 g/dL (ref 30.0–36.0)
MCV: 89.7 fL (ref 80.0–100.0)
Monocytes Absolute: 1.1 10*3/uL — ABNORMAL HIGH (ref 0.1–1.0)
Monocytes Relative: 7 %
Neutro Abs: 12.7 10*3/uL — ABNORMAL HIGH (ref 1.7–7.7)
Neutrophils Relative %: 78 %
Platelets: 282 10*3/uL (ref 150–400)
RBC: 4.16 MIL/uL (ref 3.87–5.11)
RDW: 13.5 % (ref 11.5–15.5)
WBC: 16.4 10*3/uL — ABNORMAL HIGH (ref 4.0–10.5)
nRBC: 0 % (ref 0.0–0.2)

## 2020-07-21 LAB — COMPREHENSIVE METABOLIC PANEL
ALT: 26 U/L (ref 0–44)
AST: 20 U/L (ref 15–41)
Albumin: 4 g/dL (ref 3.5–5.0)
Alkaline Phosphatase: 81 U/L (ref 38–126)
Anion gap: 11 (ref 5–15)
BUN: 11 mg/dL (ref 6–20)
CO2: 23 mmol/L (ref 22–32)
Calcium: 8.4 mg/dL — ABNORMAL LOW (ref 8.9–10.3)
Chloride: 100 mmol/L (ref 98–111)
Creatinine, Ser: 0.56 mg/dL (ref 0.44–1.00)
GFR calc Af Amer: >60 mL/min (ref >60)
GFR calc non Af Amer: >60 mL/min (ref >60)
Glucose, Bld: 103 mg/dL — ABNORMAL HIGH (ref 70–99)
Potassium: 3.3 mmol/L — ABNORMAL LOW (ref 3.5–5.1)
Sodium: 134 mmol/L — ABNORMAL LOW (ref 135–145)
Total Bilirubin: 0.9 mg/dL (ref 0.3–1.2)
Total Protein: 8.1 g/dL (ref 6.5–8.1)

## 2020-07-21 LAB — URINALYSIS, COMPLETE (UACMP) WITH MICROSCOPIC
Bilirubin Urine: NEGATIVE
Glucose, UA: NEGATIVE mg/dL
Hgb urine dipstick: NEGATIVE
Ketones, ur: 5 mg/dL — AB
Leukocytes,Ua: NEGATIVE
Nitrite: NEGATIVE
Protein, ur: NEGATIVE mg/dL
Specific Gravity, Urine: 1.019 (ref 1.005–1.030)
pH: 6 (ref 5.0–8.0)

## 2020-07-21 LAB — POCT PREGNANCY, URINE: Preg Test, Ur: NEGATIVE

## 2020-07-21 LAB — LIPASE, BLOOD: Lipase: 25 U/L (ref 11–51)

## 2020-07-21 MED ORDER — ONDANSETRON 4 MG PO TBDP
4.0000 mg | ORAL_TABLET | Freq: Once | ORAL | Status: AC
  Administered 2020-07-21: 4 mg via ORAL
  Filled 2020-07-21: qty 1

## 2020-07-21 NOTE — ED Triage Notes (Signed)
Pt to triage via w/c with no distress noted; pt reports mid upper abd pain radiating around into back since this am accomp by N/V

## 2020-07-22 ENCOUNTER — Emergency Department
Admission: EM | Admit: 2020-07-22 | Discharge: 2020-07-22 | Disposition: A | Discharge status: Home / Self Care | Payer: Self-pay | Attending: Emergency Medicine | Admitting: Emergency Medicine

## 2020-07-22 NOTE — ED Notes (Signed)
No answer when called several times from lobby & outside 

## 2021-04-09 IMAGING — CT CT ABD-PELV W/ CM
2 of 4 series · 17 of 46 positions shown, 19 images · IV contrast (APPLIED)
Comparison: None.

CLINICAL DATA: Elevated white count.  Left lower quadrant pain.

EXAM:
CT ABDOMEN AND PELVIS WITH CONTRAST
TECHNIQUE: Multidetector CT imaging of the abdomen and pelvis was performed
using the standard protocol following bolus administration of
intravenous contrast.
CONTRAST:  100mL OMNIPAQUE IOHEXOL 300 MG/ML  SOLN

[Series 2: routine abd/pel with · axial · 0.75mm/px · z∈[-523,-98]mm · 14 of 93 slices shown, 16 images]
[im 4/93  soft-tissue]
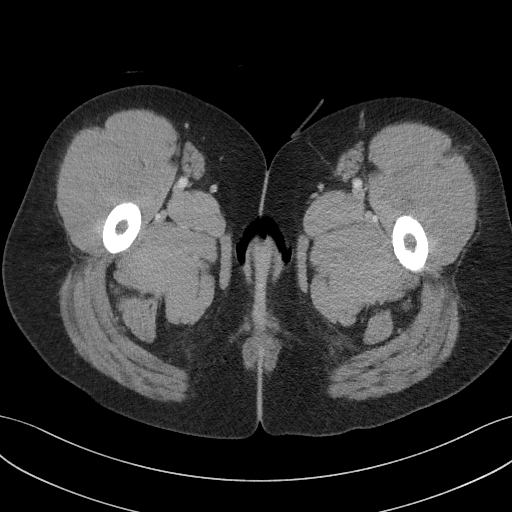
[im 4/93  bone]
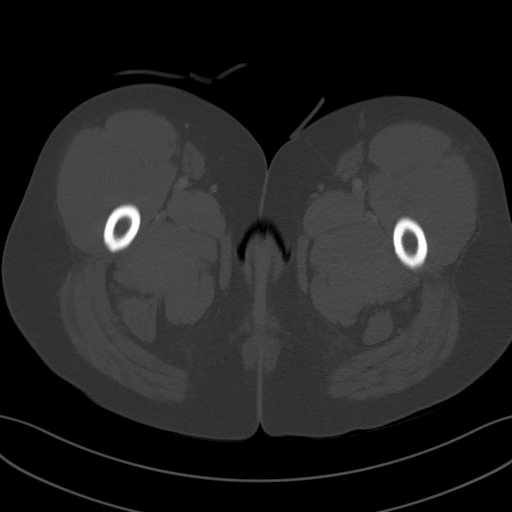
[im 12/93  soft-tissue]
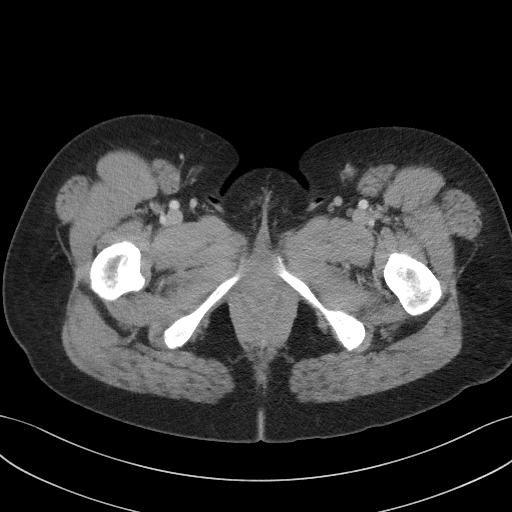
[im 20/93  soft-tissue]
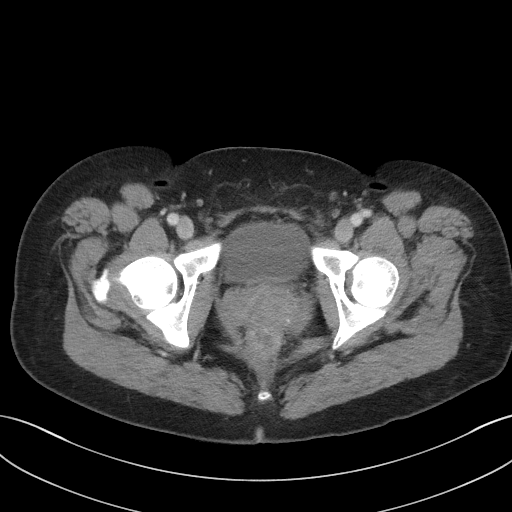
[im 24/93  soft-tissue]
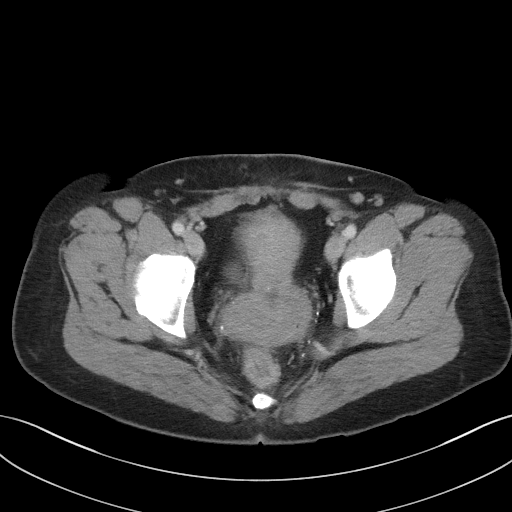
[im 31/93  soft-tissue]
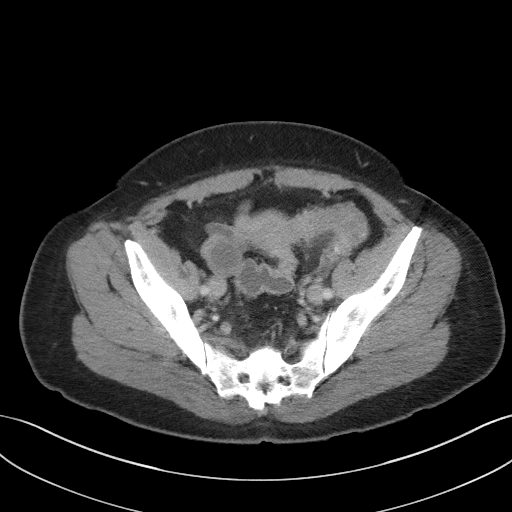
[im 39/93  soft-tissue]
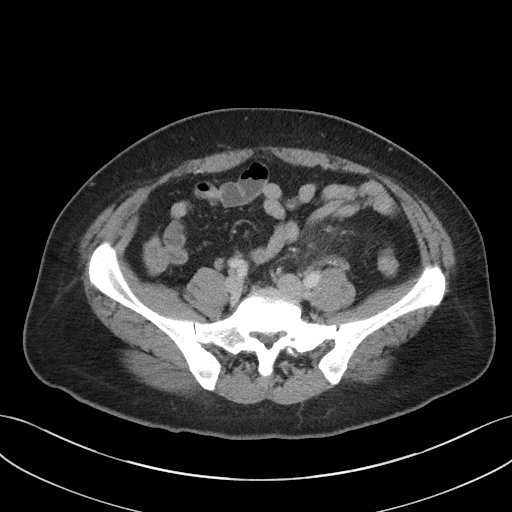
[im 43/93  soft-tissue]
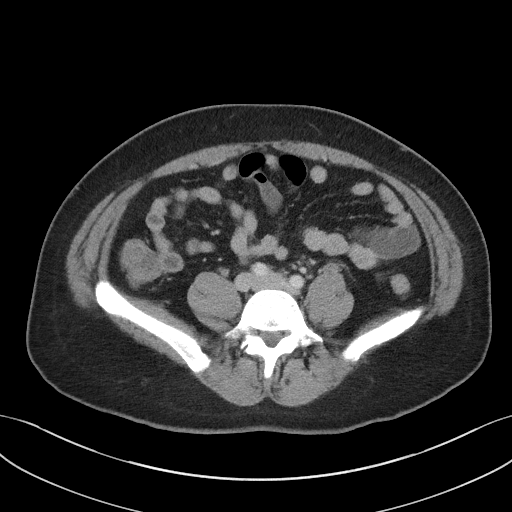
[im 50/93  soft-tissue]
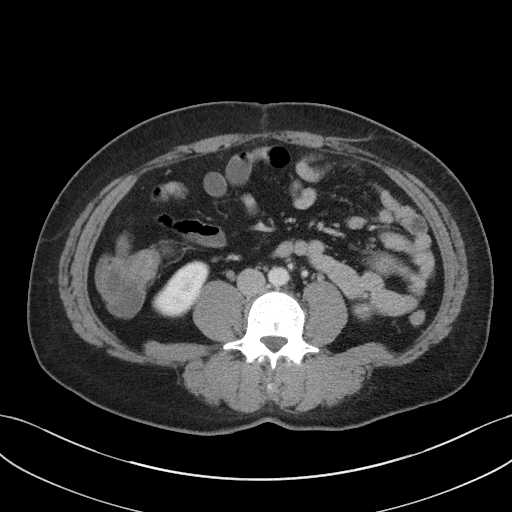
[im 54/93  soft-tissue]
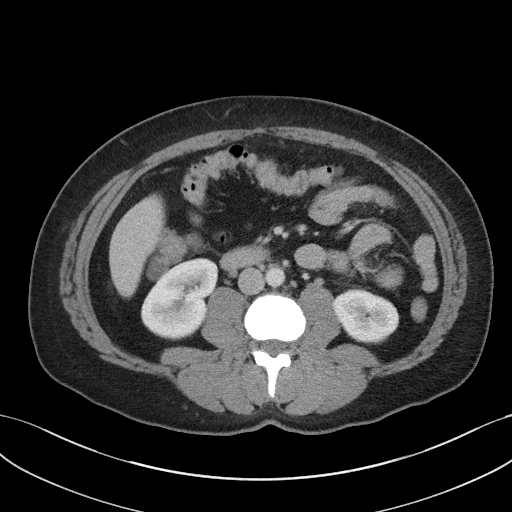
[im 54/93  bone]
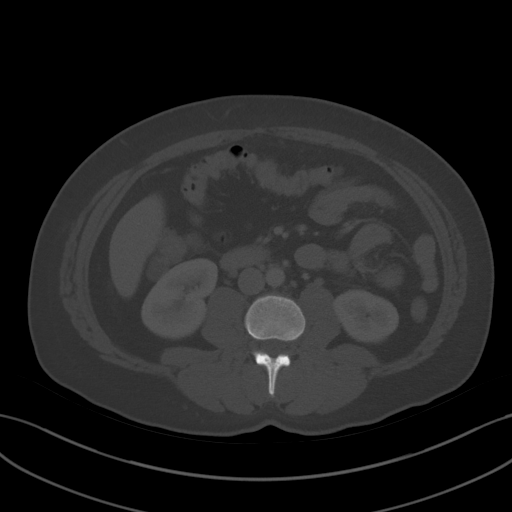
[im 62/93  soft-tissue]
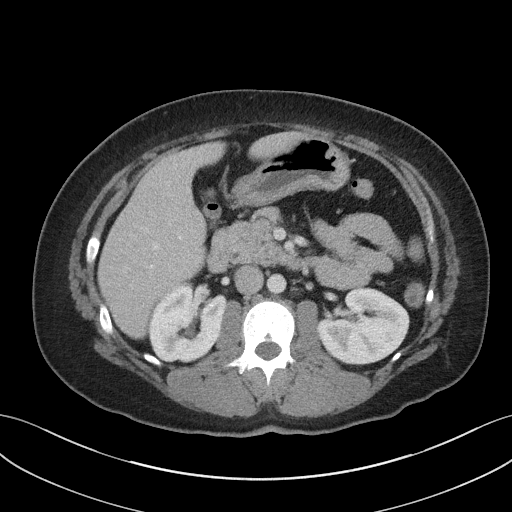
[im 70/93  soft-tissue]
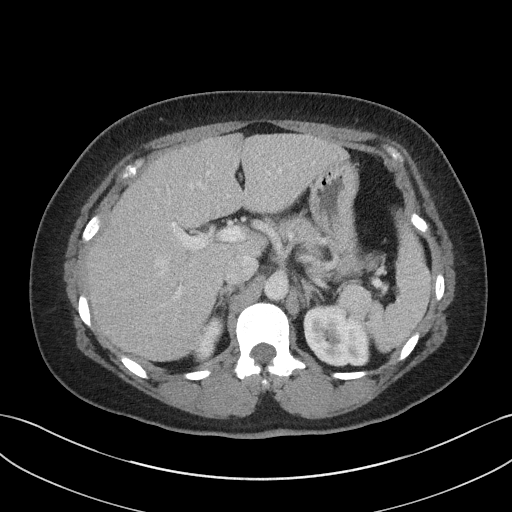
[im 73/93  soft-tissue]
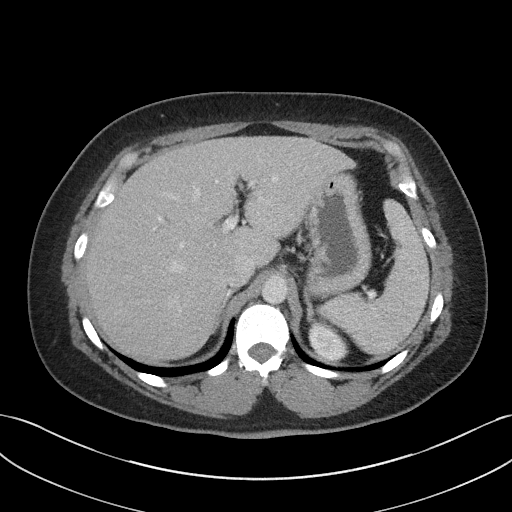
[im 81/93  soft-tissue]
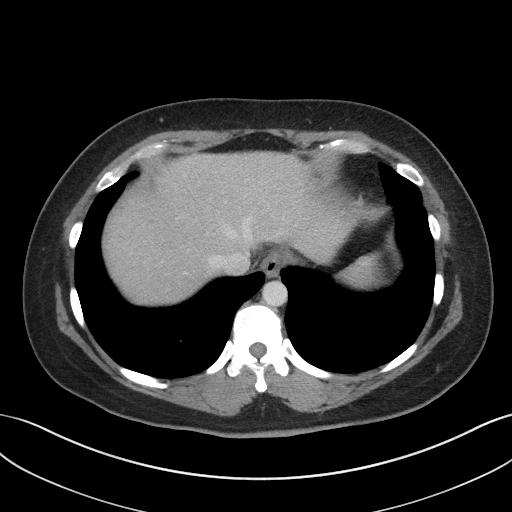
[im 89/93  soft-tissue]
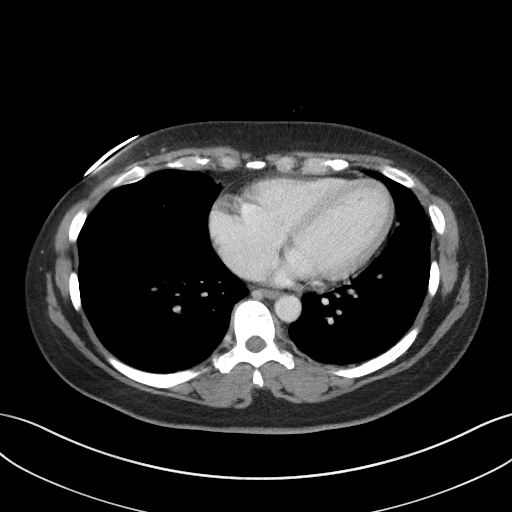

[Series 5: coronal st · coronal · 0.78mm/px · 3 of 83 slices shown]
[im 28/83  soft-tissue]
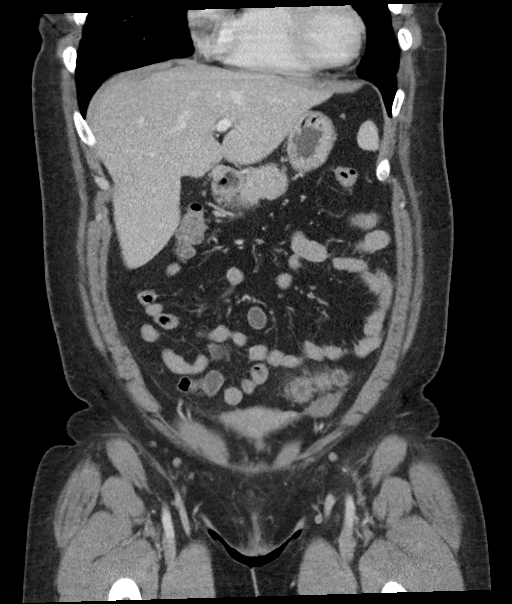
[im 37/83  soft-tissue]
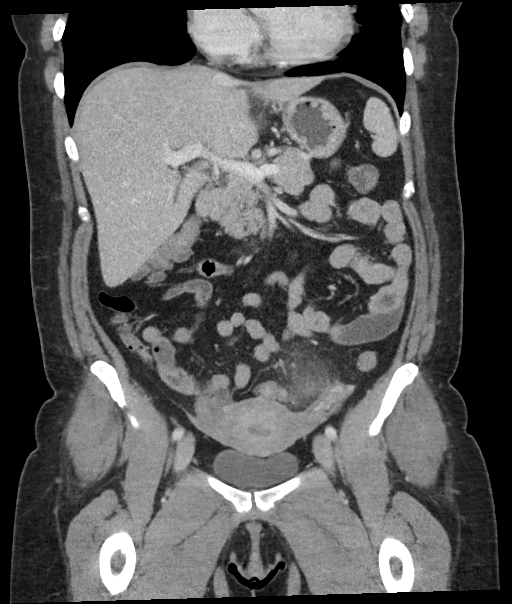
[im 46/83  soft-tissue]
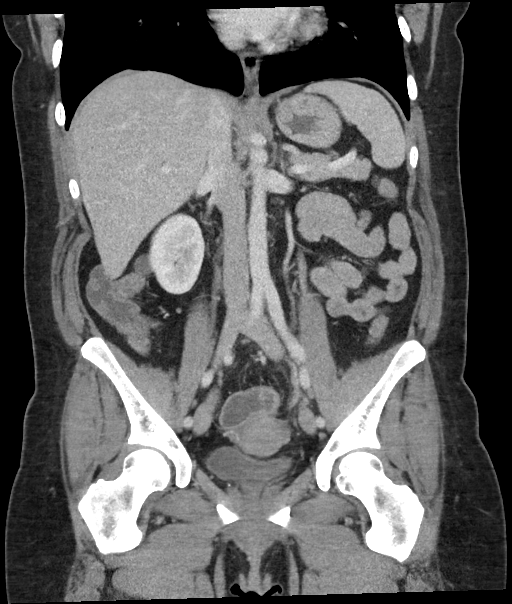

[17 of 46 positions shown; findings below may reference images not displayed]

FINDINGS: Lower chest: Normal

Hepatobiliary: Previous cholecystectomy. Liver parenchyma is normal.

Pancreas: Normal

Spleen: Normal

Adrenals/Urinary Tract: Adrenal glands are normal. Kidneys are
normal. Bladder is normal.

Stomach/Bowel: Acute diverticulitis at the descending sigmoid
junction region. Stranding of the fat without evidence of drainable
abscess. No free air or fluid.

Vascular/Lymphatic: Normal

Reproductive: Normal

Other: None

Musculoskeletal: Normal
IMPRESSION: Acute diverticulitis at the descending sigmoid junction. Stranding
in the adjacent fat but without evidence of abscess formation, free
fluid or air.

## 2022-02-27 ENCOUNTER — Emergency Department
Admission: EM | Admit: 2022-02-27 | Discharge: 2022-02-27 | Disposition: A | Discharge status: Home / Self Care | Payer: 59 | Attending: Emergency Medicine | Admitting: Emergency Medicine

## 2022-02-27 ENCOUNTER — Emergency Department: Payer: 59

## 2022-02-27 ENCOUNTER — Encounter: Payer: Self-pay | Admitting: Emergency Medicine

## 2022-02-27 ENCOUNTER — Other Ambulatory Visit: Payer: Self-pay

## 2022-02-27 DIAGNOSIS — R1031 Right lower quadrant pain: Secondary | ICD-10-CM | POA: Diagnosis present

## 2022-02-27 DIAGNOSIS — K529 Noninfective gastroenteritis and colitis, unspecified: Secondary | ICD-10-CM | POA: Insufficient documentation

## 2022-02-27 DIAGNOSIS — R109 Unspecified abdominal pain: Secondary | ICD-10-CM

## 2022-02-27 HISTORY — DX: Diverticulitis of intestine, part unspecified, without perforation or abscess without bleeding: K57.92

## 2022-02-27 LAB — POC URINE PREG, ED: Preg Test, Ur: NEGATIVE

## 2022-02-27 LAB — COMPREHENSIVE METABOLIC PANEL
ALT: 29 U/L (ref 0–44)
AST: 22 U/L (ref 15–41)
Albumin: 3.9 g/dL (ref 3.5–5.0)
Alkaline Phosphatase: 70 U/L (ref 38–126)
Anion gap: 6 (ref 5–15)
BUN: 16 mg/dL (ref 6–20)
CO2: 31 mmol/L (ref 22–32)
Calcium: 8.6 mg/dL — ABNORMAL LOW (ref 8.9–10.3)
Chloride: 100 mmol/L (ref 98–111)
Creatinine, Ser: 0.58 mg/dL (ref 0.44–1.00)
GFR, Estimated: >60 mL/min (ref >60)
Glucose, Bld: 80 mg/dL (ref 70–99)
Potassium: 3.8 mmol/L (ref 3.5–5.1)
Sodium: 137 mmol/L (ref 135–145)
Total Bilirubin: 0.6 mg/dL (ref 0.3–1.2)
Total Protein: 8.1 g/dL (ref 6.5–8.1)

## 2022-02-27 LAB — URINALYSIS, ROUTINE W REFLEX MICROSCOPIC
Bilirubin Urine: NEGATIVE
Glucose, UA: NEGATIVE mg/dL
Ketones, ur: NEGATIVE mg/dL
Leukocytes,Ua: NEGATIVE
Nitrite: NEGATIVE
Protein, ur: NEGATIVE mg/dL
Specific Gravity, Urine: 1.013 (ref 1.005–1.030)
pH: 7 (ref 5.0–8.0)

## 2022-02-27 LAB — CBC
HCT: 38.6 % (ref 36.0–46.0)
Hemoglobin: 12.4 g/dL (ref 12.0–15.0)
MCH: 28.4 pg (ref 26.0–34.0)
MCHC: 32.1 g/dL (ref 30.0–36.0)
MCV: 88.3 fL (ref 80.0–100.0)
Platelets: 300 10*3/uL (ref 150–400)
RBC: 4.37 MIL/uL (ref 3.87–5.11)
RDW: 12.8 % (ref 11.5–15.5)
WBC: 12.2 10*3/uL — ABNORMAL HIGH (ref 4.0–10.5)
nRBC: 0 % (ref 0.0–0.2)

## 2022-02-27 LAB — LIPASE, BLOOD: Lipase: 42 U/L (ref 11–51)

## 2022-02-27 MED ORDER — MORPHINE SULFATE (PF) 4 MG/ML IV SOLN
4.0000 mg | Freq: Once | INTRAVENOUS | Status: AC
  Administered 2022-02-27: 4 mg via INTRAVENOUS
  Filled 2022-02-27: qty 1

## 2022-02-27 MED ORDER — ONDANSETRON 4 MG PO TBDP: 4.0000 mg | ORAL_TABLET | Freq: Three times a day (TID) | ORAL | 0 refills | Status: DC | PRN

## 2022-02-27 MED ORDER — ONDANSETRON HCL 4 MG/2ML IJ SOLN
4.0000 mg | Freq: Once | INTRAMUSCULAR | Status: AC
  Administered 2022-02-27: 4 mg via INTRAVENOUS
  Filled 2022-02-27: qty 2

## 2022-02-27 MED ORDER — AMOXICILLIN-POT CLAVULANATE 875-125 MG PO TABS: 1.0000 | ORAL_TABLET | Freq: Two times a day (BID) | ORAL | 0 refills | Status: AC

## 2022-02-27 MED ORDER — HYDROCODONE-ACETAMINOPHEN 5-325 MG PO TABS: 1.0000 | ORAL_TABLET | Freq: Four times a day (QID) | ORAL | 0 refills | Status: DC | PRN

## 2022-02-27 MED ORDER — IOHEXOL 300 MG/ML  SOLN
100.0000 mL | Freq: Once | INTRAMUSCULAR | Status: AC | PRN
  Administered 2022-02-27: 100 mL via INTRAVENOUS

## 2022-02-27 NOTE — ED Provider Notes (Signed)
? ?[facility] ?Provider Note ? ? ? Event Date/Time  ? First MD Initiated Contact with Patient 02/27/22 1820   ?  (approximate) ? ? ?History  ? ?Abdominal Pain ? ? ?HPI ? ?Barbara Gregory is a 41 y.o. female who complains of right-sided abdominal pain starting last night.  She is nauseated but has not vomited.  She has some constipation.  She reports it feels something like when she had diverticulitis in the past.  She has had her appendix out.  The pain is up under the ribs and runs down into the right lower quadrant and a little bit over toward the left side.  She is not running a fever.  Pain seems to be worse with jostling. ? ?  ? ? ?Physical Exam  ? ?Triage Vital Signs: ?ED Triage Vitals  ?Enc Vitals Group  ?   BP 02/27/22 1747 128/87  ?   Pulse Rate 02/27/22 1747 84  ?   Resp 02/27/22 1747 20  ?   Temp 02/27/22 1747 98.8 ?F (37.1 ?C)  ?   Temp Source 02/27/22 1747 Oral  ?   SpO2 02/27/22 1747 94 %  ?   Weight 02/27/22 1748 155 lb (70.3 kg)  ?   Height 02/27/22 1748 4\' 11"  (1.499 m)  ?   Head Circumference --   ?   Peak Flow --   ?   Pain Score 02/27/22 1748 7  ?   Pain Loc --   ?   Pain Edu? --   ?   Excl. in GC? --   ? ? ?Most recent vital signs: ?Vitals:  ? 02/27/22 1830 02/27/22 1845  ?BP: 104/65 121/83  ?Pulse: 84 85  ?Resp: 20 20  ?Temp:    ?SpO2: 99% 99%  ? ? ? ?General: Awake, alert, uncomfortable looking ?CV:  Good peripheral perfusion.  Heart regular rate and rhythm no audible murmurs ?Resp:  Normal effort.  Lungs are clear ?Abd:  No distention.  No palpable organomegaly.  There is tenderness in the right upper quadrant and down the right side of the abdomen.  There is tenderness to palpation and percussion. ?Extremities: No edema ? ? ?ED Results / Procedures / Treatments  ? ?Labs ?(all labs ordered are listed, but only abnormal results are displayed) ?Labs Reviewed  ?COMPREHENSIVE METABOLIC PANEL - Abnormal; Notable for the following components:  ?    Result Value  ? Calcium 8.6  (*)   ? All other components within normal limits  ?CBC - Abnormal; Notable for the following components:  ? WBC 12.2 (*)   ? All other components within normal limits  ?URINALYSIS, ROUTINE W REFLEX MICROSCOPIC - Abnormal; Notable for the following components:  ? Color, Urine YELLOW (*)   ? APPearance CLEAR (*)   ? Hgb urine dipstick SMALL (*)   ? Bacteria, UA RARE (*)   ? All other components within normal limits  ?LIPASE, BLOOD  ?POC URINE PREG, ED  ? ? ? ?EKG ? ? ? ? ?RADIOLOGY ? ?CT read by radiology films reviewed by me is read as enteritis with fluid in the small bowel.  There have been several cases of this in the last week or so. ? ?PROCEDURES: ?Critical Care performed:  ? ?Procedures ? ? ?MEDICATIONS ORDERED IN ED: ?Medications  ?morphine (PF) 4 MG/ML injection 4 mg (4 mg Intravenous Given 02/27/22 1838)  ?ondansetron [facility]) injection 4 mg (4 mg Intravenous Given 02/27/22 1838)  ?iohexol (OMNIPAQUE) 300  MG/ML solution 100 mL (100 mLs Intravenous Contrast Given 02/27/22 1910)  ? ? ? ?IMPRESSION / MDM / ASSESSMENT AND PLAN / ED COURSE  ?I reviewed the triage vital signs and the nursing notes. ? ?There is no sign of any perforation or gallbladder disease or diverticulitis.  I will give her Augmentin 1 twice a day for the possibility of bacterial infection.  She does have a high white blood. I will give her Vicodin for pain and Zofran if needed for nausea.  I have warned her to return if she gets worse even if it is at 3:00 in the morning this morning.  She understands this quite well.  She will also return if she is not any better in about a week.  There is no sign of any kidney problem or any other electrolyte imbalance etc. ? ? ?  ? ? ?FINAL CLINICAL IMPRESSION(S) / ED DIAGNOSES  ? ?Final diagnoses:  ?Abdominal pain, unspecified abdominal location  ?Enteritis  ? ? ? ?Rx / DC Orders  ? ?ED Discharge Orders   ? ?      Ordered  ?  amoxicillin-clavulanate (AUGMENTIN) 875-125 MG tablet  2 times daily       ?  02/27/22 1955  ?  HYDROcodone-acetaminophen (NORCO/VICODIN) 5-325 MG tablet  Every 6 hours PRN       ? 02/27/22 1955  ?  ondansetron (ZOFRAN-ODT) 4 MG disintegrating tablet  Every 8 hours PRN       ? 02/27/22 1955  ? ?  ?  ? ?  ? ? ? ?Note:  This document was prepared using Dragon voice recognition software and may include unintentional dictation errors. ?  ?Arnaldo Natal, MD ?02/27/22 2001 ? ?

## 2022-02-27 NOTE — ED Triage Notes (Signed)
Pt via POV from home. Pt c/o RLQ pain and nausea since last night. Pt has a hx of appendectomy. Pt has a hx of diverticulitis and states she think that is what it is. Denies diarrhea. Denies fever. Denies urinary symptoms. Pt is A&OX4 and NAD ?

## 2022-02-27 NOTE — Discharge Instructions (Addendum)
[facility] [address] closes at 10 and [facility] is supposed to close at 11 so you can go to get things filled there.  I have sent the prescriptions to both. ? ?I have given you a prescription for Zofran ODT.  These are melt on your tongue wafers.  You can use 1 3 times a day.  They will help with the nausea.  I have also given you a prescription for pain pills.  These are [place] in you can use 1 pill 4 times a day for pain.  They can make you constipated and woozy so do not drive on them.  If you drive on them the police may catch you and consider you an impaired driver.  I have given you prescription for antibiotics.  This is Augmentin.  1 pill twice a day.  Take it with food.  Sometimes it can cause diarrhea if you take it without food. ? ?The CT scan and blood work looks like you have enteritis which is a infection in the bowel.  The Augmentin should help.  Please return if you have worsening pain or fever or vomiting or severe diarrhea.  Also please return if you are not any better in about a week.   ? ?

## 2022-02-27 NOTE — ED Notes (Signed)
Dc instructions and scripts reviewed with pt no questions or concerns at this time.  

## 2022-03-12 ENCOUNTER — Emergency Department: Payer: 59

## 2022-03-12 ENCOUNTER — Other Ambulatory Visit: Payer: Self-pay

## 2022-03-12 ENCOUNTER — Emergency Department
Admission: EM | Admit: 2022-03-12 | Discharge: 2022-03-12 | Disposition: A | Discharge status: Home / Self Care | Payer: 59 | Attending: Emergency Medicine | Admitting: Emergency Medicine

## 2022-03-12 ENCOUNTER — Encounter: Payer: Self-pay | Admitting: Emergency Medicine

## 2022-03-12 DIAGNOSIS — R110 Nausea: Secondary | ICD-10-CM | POA: Diagnosis not present

## 2022-03-12 DIAGNOSIS — R519 Headache, unspecified: Secondary | ICD-10-CM | POA: Insufficient documentation

## 2022-03-12 LAB — CBC WITH DIFFERENTIAL/PLATELET
Abs Immature Granulocytes: 0.02 10*3/uL (ref 0.00–0.07)
Basophils Absolute: 0 10*3/uL (ref 0.0–0.1)
Basophils Relative: 1 %
Eosinophils Absolute: 0.2 10*3/uL (ref 0.0–0.5)
Eosinophils Relative: 3 %
HCT: 39.9 % (ref 36.0–46.0)
Hemoglobin: 12.8 g/dL (ref 12.0–15.0)
Immature Granulocytes: 0 %
Lymphocytes Relative: 35 %
Lymphs Abs: 2.3 10*3/uL (ref 0.7–4.0)
MCH: 28.1 pg (ref 26.0–34.0)
MCHC: 32.1 g/dL (ref 30.0–36.0)
MCV: 87.7 fL (ref 80.0–100.0)
Monocytes Absolute: 0.4 10*3/uL (ref 0.1–1.0)
Monocytes Relative: 6 %
Neutro Abs: 3.6 10*3/uL (ref 1.7–7.7)
Neutrophils Relative %: 55 %
Platelets: 291 10*3/uL (ref 150–400)
RBC: 4.55 MIL/uL (ref 3.87–5.11)
RDW: 13.3 % (ref 11.5–15.5)
WBC: 6.4 10*3/uL (ref 4.0–10.5)
nRBC: 0 % (ref 0.0–0.2)

## 2022-03-12 LAB — BASIC METABOLIC PANEL
Anion gap: 3 — ABNORMAL LOW (ref 5–15)
BUN: 14 mg/dL (ref 6–20)
CO2: 25 mmol/L (ref 22–32)
Calcium: 8.5 mg/dL — ABNORMAL LOW (ref 8.9–10.3)
Chloride: 104 mmol/L (ref 98–111)
Creatinine, Ser: 0.51 mg/dL (ref 0.44–1.00)
GFR, Estimated: >60 mL/min (ref >60)
Glucose, Bld: 97 mg/dL (ref 70–99)
Potassium: 3.8 mmol/L (ref 3.5–5.1)
Sodium: 132 mmol/L — ABNORMAL LOW (ref 135–145)

## 2022-03-12 MED ORDER — METOCLOPRAMIDE HCL 10 MG PO TABS: 10.0000 mg | ORAL_TABLET | Freq: Three times a day (TID) | ORAL | 0 refills | Status: DC

## 2022-03-12 MED ORDER — DIPHENHYDRAMINE HCL 50 MG/ML IJ SOLN
50.0000 mg | Freq: Once | INTRAMUSCULAR | Status: AC
  Administered 2022-03-12: 50 mg via INTRAVENOUS
  Filled 2022-03-12: qty 1

## 2022-03-12 MED ORDER — METOCLOPRAMIDE HCL 5 MG/ML IJ SOLN
10.0000 mg | Freq: Once | INTRAMUSCULAR | Status: AC
  Administered 2022-03-12: 10 mg via INTRAVENOUS
  Filled 2022-03-12: qty 2

## 2022-03-12 NOTE — ED Triage Notes (Signed)
Pt reports headache for the last 3 days unrelieved with OTC meds. Pt reports pain is like a band around her head and she feels nauseated.  ?

## 2022-03-12 NOTE — Discharge Instructions (Addendum)
Your exam, labs, and CT are normal and reassuring at this time.  Your headache has been treated with antinausea medicine as well as Benadryl (50 mg).  You may continue with the same at home including Tylenol (1000 mg) and Motrin (800 mg) as needed for additional headache pain relief. You should follow-up with primary provider at [facility] or return to the ED if needed. ?

## 2022-03-12 NOTE — ED Provider Notes (Signed)
? ? ?[facility] ?Emergency Department Provider Note ? ? ? ? Event Date/Time  ? First MD Initiated Contact with Patient 03/12/22 1324   ?  (approximate) ? ? ?History  ? ?Headache and Nausea ? ? ?HPI ? ?Barbara Gregory is a 41 y.o. female with a history of diverticulitis presents to the ED for evaluation of a 3-day headache. She notes symptoms are bandlike distribution with associated nausea. She denies any relief with OTC meds.  Denies a history of persistent or chronic headache syndromes of migraines. ?  ? ? ?Physical Exam  ? ?Triage Vital Signs: ?ED Triage Vitals  ?Enc Vitals Group  ?   BP 03/12/22 1310 (!) 146/106  ?   Pulse Rate 03/12/22 1310 78  ?   Resp 03/12/22 1310 17  ?   Temp 03/12/22 1310 98.4 ?F (36.9 ?C)  ?   Temp src --   ?   SpO2 03/12/22 1310 95 %  ?   Weight 03/12/22 1305 156 lb 8.4 oz (71 kg)  ?   Height 03/12/22 1305 4\' 11"  (1.499 m)  ?   Head Circumference --   ?   Peak Flow --   ?   Pain Score 03/12/22 1304 9  ?   Pain Loc --   ?   Pain Edu? --   ?   Excl. in GC? --   ? ? ?Most recent vital signs: ?Vitals:  ? 03/12/22 1310  ?BP: (!) 146/106  ?Pulse: 78  ?Resp: 17  ?Temp: 98.4 ?F (36.9 ?C)  ?SpO2: 95%  ? ? ?General Awake, no distress. Tearful ?HEENT NCAT. PERRL. EOMI. No rhinorrhea. Mucous membranes are moist.  ?CV:  Good peripheral perfusion.  ?RESP:  Normal effort.  ?ABD:  No distention.  ?NEURO: CN II-XII grossly intact ? ?ED Results / Procedures / Treatments  ? ?Labs ?(all labs ordered are listed, but only abnormal results are displayed) ?Labs Reviewed  ?BASIC METABOLIC PANEL - Abnormal; Notable for the following components:  ?    Result Value  ? Sodium 132 (*)   ? Calcium 8.5 (*)   ? Anion gap 3 (*)   ? All other components within normal limits  ?CBC WITH DIFFERENTIAL/PLATELET  ? ? ? ?EKG ? ? ? ?RADIOLOGY ? ?I personally viewed and evaluated these images as part of my medical decision making, as well as reviewing the written report by the radiologist. ? ?ED Provider  Interpretation: no acute findings} ? ?CT HEAD WO CONTRAST (03/14/22) ? ?Result Date: 03/12/2022 ?CLINICAL DATA:  Headache EXAM: CT HEAD WITHOUT CONTRAST TECHNIQUE: Contiguous axial images were obtained from the base of the skull through the vertex without intravenous contrast. RADIATION DOSE REDUCTION: This exam was performed according to the departmental dose-optimization program which includes automated exposure control, adjustment of the mA and/or kV according to patient size and/or use of iterative reconstruction technique. COMPARISON:  None. FINDINGS: Brain: No evidence of acute infarction, hemorrhage, hydrocephalus, extra-axial collection or mass lesion/mass effect. Vascular: No hyperdense vessel or unexpected calcification. Skull: Normal. Negative for fracture or focal lesion. Sinuses/Orbits: No acute finding. Other: None. IMPRESSION: No acute intracranial pathology. Electronically Signed   By: 03/14/2022 M.D.   On: 03/12/2022 15:16   ? ? ?PROCEDURES: ? ?Critical Care performed: No ? ?Procedures ? ? ?MEDICATIONS ORDERED IN ED: ?Medications  ?metoCLOPramide (REGLAN) injection 10 mg (10 mg Intravenous Given 03/12/22 1350)  ?diphenhydrAMINE (BENADRYL) injection 50 mg (50 mg Intravenous Given 03/12/22 1352)  ? ? ? ?  IMPRESSION / MDM / ASSESSMENT AND PLAN / ED COURSE  ?I reviewed the triage vital signs and the nursing notes. ?             ?               ? ?Differential diagnosis includes, but is not limited to, intracranial hemorrhage, meningitis/encephalitis, previous head trauma, cavernous venous thrombosis, tension headache, temporal arteritis, migraine or migraine equivalent, idiopathic intracranial hypertension, and non-specific headache. ? ?----------------------------------------- ?4:26 PM on 03/12/2022 ?----------------------------------------- ?Interim evaluation finds patient sleeping comfortably in the room. She reports significant improvement in headache pain.  ? ?Patient to the ED for evaluation of  persistent headache for 3 days.  Patient presents to the ED for evaluation is found to have reassuring work-up overall.  No red flags on exam, and no signs of acute neuromuscular deficit or evidence of closed head injury.  No CBC evidence of a critical anemia or leukocytosis.  No CBC evidence of electrolyte abnormality.  Further evaluation with head CT with his new/worsening headache is reassuring x-ray shows no acute intracranial process based on my review of images.  Patient is treated with IV medications including diphenhydramine and metoclopramide.  Patient's diagnosis is consistent with severe headache. Patient will be discharged home with prescriptions for Reglan. Patient is to follow up with her primary provider as needed or otherwise directed. Patient is given ED precautions to return to the ED for any worsening or new symptoms. ? ? ?FINAL CLINICAL IMPRESSION(S) / ED DIAGNOSES  ? ?Final diagnoses:  ?Bad headache  ? ? ? ?Rx / DC Orders  ? ?ED Discharge Orders   ? ?      Ordered  ?  metoCLOPramide (REGLAN) 10 MG tablet  3 times daily with meals       ? 03/12/22 1629  ? ?  ?  ? ?  ? ? ? ?Note:  This document was prepared using Dragon voice recognition software and may include unintentional dictation errors. ? ?  ?Lissa Hoard, PA-C ?03/12/22 1706 ? ?  ?Shaune Pollack, MD ?03/16/22 1043 ? ?

## 2023-07-14 ENCOUNTER — Other Ambulatory Visit: Payer: Self-pay

## 2023-07-14 ENCOUNTER — Emergency Department: Payer: Self-pay

## 2023-07-14 ENCOUNTER — Inpatient Hospital Stay
Admission: EM | Admit: 2023-07-14 | Discharge: 2023-07-17 | DRG: 392 | Disposition: A | Discharge status: Home / Self Care | Payer: Self-pay | Attending: Internal Medicine | Admitting: Internal Medicine

## 2023-07-14 DIAGNOSIS — K5732 Diverticulitis of large intestine without perforation or abscess without bleeding: Secondary | ICD-10-CM | POA: Diagnosis present

## 2023-07-14 DIAGNOSIS — Z79899 Other long term (current) drug therapy: Secondary | ICD-10-CM

## 2023-07-14 DIAGNOSIS — J449 Chronic obstructive pulmonary disease, unspecified: Secondary | ICD-10-CM | POA: Diagnosis present

## 2023-07-14 DIAGNOSIS — E876 Hypokalemia: Secondary | ICD-10-CM | POA: Diagnosis present

## 2023-07-14 DIAGNOSIS — Z6832 Body mass index (BMI) 32.0-32.9, adult: Secondary | ICD-10-CM

## 2023-07-14 DIAGNOSIS — F32A Depression, unspecified: Secondary | ICD-10-CM | POA: Diagnosis present

## 2023-07-14 DIAGNOSIS — E669 Obesity, unspecified: Secondary | ICD-10-CM | POA: Diagnosis present

## 2023-07-14 DIAGNOSIS — K5792 Diverticulitis of intestine, part unspecified, without perforation or abscess without bleeding: Principal | ICD-10-CM | POA: Diagnosis present

## 2023-07-14 DIAGNOSIS — D72829 Elevated white blood cell count, unspecified: Secondary | ICD-10-CM | POA: Diagnosis present

## 2023-07-14 DIAGNOSIS — K5720 Diverticulitis of large intestine with perforation and abscess without bleeding: Principal | ICD-10-CM | POA: Diagnosis present

## 2023-07-14 DIAGNOSIS — D649 Anemia, unspecified: Secondary | ICD-10-CM | POA: Diagnosis present

## 2023-07-14 DIAGNOSIS — I2510 Atherosclerotic heart disease of native coronary artery without angina pectoris: Secondary | ICD-10-CM | POA: Diagnosis present

## 2023-07-14 LAB — COMPREHENSIVE METABOLIC PANEL
ALT: 26 U/L (ref 0–44)
AST: 21 U/L (ref 15–41)
Albumin: 3.4 g/dL — ABNORMAL LOW (ref 3.5–5.0)
Alkaline Phosphatase: 69 U/L (ref 38–126)
Anion gap: 7 (ref 5–15)
BUN: 15 mg/dL (ref 6–20)
CO2: 24 mmol/L (ref 22–32)
Calcium: 8.7 mg/dL — ABNORMAL LOW (ref 8.9–10.3)
Chloride: 107 mmol/L (ref 98–111)
Creatinine, Ser: 0.66 mg/dL (ref 0.44–1.00)
GFR, Estimated: >60 mL/min (ref >60)
Glucose, Bld: 116 mg/dL — ABNORMAL HIGH (ref 70–99)
Potassium: 3.5 mmol/L (ref 3.5–5.1)
Sodium: 138 mmol/L (ref 135–145)
Total Bilirubin: 0.6 mg/dL (ref 0.3–1.2)
Total Protein: 7.9 g/dL (ref 6.5–8.1)

## 2023-07-14 LAB — POC URINE PREG, ED: Preg Test, Ur: NEGATIVE

## 2023-07-14 LAB — URINALYSIS, ROUTINE W REFLEX MICROSCOPIC
Bacteria, UA: NONE SEEN
Bilirubin Urine: NEGATIVE
Glucose, UA: NEGATIVE mg/dL
Ketones, ur: NEGATIVE mg/dL
Leukocytes,Ua: NEGATIVE
Nitrite: NEGATIVE
Protein, ur: NEGATIVE mg/dL
Specific Gravity, Urine: 1.016 (ref 1.005–1.030)
pH: 6 (ref 5.0–8.0)

## 2023-07-14 LAB — CBC
HCT: 36.6 % (ref 36.0–46.0)
Hemoglobin: 11.7 g/dL — ABNORMAL LOW (ref 12.0–15.0)
MCH: 29.2 pg (ref 26.0–34.0)
MCHC: 32 g/dL (ref 30.0–36.0)
MCV: 91.3 fL (ref 80.0–100.0)
Platelets: 270 10*3/uL (ref 150–400)
RBC: 4.01 MIL/uL (ref 3.87–5.11)
RDW: 14.2 % (ref 11.5–15.5)
WBC: 12.4 10*3/uL — ABNORMAL HIGH (ref 4.0–10.5)
nRBC: 0 % (ref 0.0–0.2)

## 2023-07-14 LAB — LIPASE, BLOOD: Lipase: 40 U/L (ref 11–51)

## 2023-07-14 MED ORDER — MORPHINE SULFATE (PF) 4 MG/ML IV SOLN
4.0000 mg | Freq: Once | INTRAVENOUS | Status: AC
  Administered 2023-07-14: 4 mg via INTRAVENOUS
  Filled 2023-07-14: qty 1

## 2023-07-14 MED ORDER — LORAZEPAM 1 MG PO TABS: 1.0000 mg | ORAL_TABLET | Freq: Four times a day (QID) | ORAL | Status: DC | PRN

## 2023-07-14 MED ORDER — HYDROMORPHONE HCL 1 MG/ML IJ SOLN
0.5000 mg | INTRAMUSCULAR | Status: DC | PRN
  Administered 2023-07-14 – 2023-07-15 (×4): 1 mg via INTRAVENOUS
  Filled 2023-07-14 (×4): qty 1

## 2023-07-14 MED ORDER — SODIUM CHLORIDE 0.9 % IV SOLN: INTRAVENOUS | Status: AC

## 2023-07-14 MED ORDER — SODIUM CHLORIDE 0.9 % IV SOLN
12.5000 mg | Freq: Once | INTRAVENOUS | Status: AC
  Administered 2023-07-15: 12.5 mg via INTRAVENOUS
  Filled 2023-07-14: qty 0.5

## 2023-07-14 MED ORDER — SERTRALINE HCL 50 MG PO TABS
150.0000 mg | ORAL_TABLET | Freq: Every day | ORAL | Status: DC
  Administered 2023-07-14 – 2023-07-16 (×3): 150 mg via ORAL
  Filled 2023-07-14 (×3): qty 3

## 2023-07-14 MED ORDER — ONDANSETRON HCL 4 MG/2ML IJ SOLN
4.0000 mg | Freq: Four times a day (QID) | INTRAMUSCULAR | Status: DC | PRN
  Administered 2023-07-14: 4 mg via INTRAVENOUS
  Filled 2023-07-14: qty 2

## 2023-07-14 MED ORDER — ONDANSETRON HCL 4 MG/2ML IJ SOLN
4.0000 mg | Freq: Once | INTRAMUSCULAR | Status: AC
  Administered 2023-07-14: 4 mg via INTRAVENOUS
  Filled 2023-07-14: qty 2

## 2023-07-14 MED ORDER — ARIPIPRAZOLE 2 MG PO TABS: 2.0000 mg | ORAL_TABLET | Freq: Every day | ORAL | Status: DC

## 2023-07-14 MED ORDER — HYDROCODONE-ACETAMINOPHEN 5-325 MG PO TABS
1.0000 | ORAL_TABLET | Freq: Four times a day (QID) | ORAL | Status: DC | PRN
  Administered 2023-07-15 – 2023-07-16 (×3): 1 via ORAL
  Filled 2023-07-14 (×3): qty 1

## 2023-07-14 MED ORDER — SENNOSIDES-DOCUSATE SODIUM 8.6-50 MG PO TABS: 1.0000 | ORAL_TABLET | Freq: Every evening | ORAL | Status: DC | PRN

## 2023-07-14 MED ORDER — PIPERACILLIN-TAZOBACTAM 3.375 G IVPB
3.3750 g | Freq: Three times a day (TID) | INTRAVENOUS | Status: DC
  Administered 2023-07-14 – 2023-07-17 (×8): 3.375 g via INTRAVENOUS
  Filled 2023-07-14 (×8): qty 50

## 2023-07-14 MED ORDER — METOCLOPRAMIDE HCL 10 MG PO TABS
10.0000 mg | ORAL_TABLET | Freq: Three times a day (TID) | ORAL | Status: DC
  Administered 2023-07-15 – 2023-07-17 (×8): 10 mg via ORAL
  Filled 2023-07-14 (×11): qty 1

## 2023-07-14 MED ORDER — BISACODYL 5 MG PO TBEC: 5.0000 mg | DELAYED_RELEASE_TABLET | Freq: Every day | ORAL | Status: DC | PRN

## 2023-07-14 MED ORDER — PIPERACILLIN-TAZOBACTAM 3.375 G IVPB 30 MIN
3.3750 g | Freq: Once | INTRAVENOUS | Status: AC
  Administered 2023-07-14: 3.375 g via INTRAVENOUS
  Filled 2023-07-14: qty 50

## 2023-07-14 MED ORDER — ONDANSETRON 4 MG PO TBDP: 4.0000 mg | ORAL_TABLET | Freq: Three times a day (TID) | ORAL | Status: DC | PRN

## 2023-07-14 MED ORDER — ONDANSETRON HCL 4 MG PO TABS
4.0000 mg | ORAL_TABLET | Freq: Four times a day (QID) | ORAL | Status: DC | PRN
  Administered 2023-07-16: 4 mg via ORAL
  Filled 2023-07-14: qty 1

## 2023-07-14 MED ORDER — METOCLOPRAMIDE HCL 5 MG/ML IJ SOLN
10.0000 mg | Freq: Four times a day (QID) | INTRAMUSCULAR | Status: DC | PRN
  Administered 2023-07-14: 10 mg via INTRAVENOUS
  Filled 2023-07-14: qty 2

## 2023-07-14 MED ORDER — IOHEXOL 300 MG/ML  SOLN
100.0000 mL | Freq: Once | INTRAMUSCULAR | Status: AC | PRN
  Administered 2023-07-14: 80 mL via INTRAVENOUS

## 2023-07-14 NOTE — H&P (Signed)
History and Physical    Barbara Gregory ZOX:[id number] DOB: 10/07/1981 DOA: 07/14/2023  [facility]P: [facility] (Confirm with patient/family/NH records and if not entered, this has to be entered at [facility] point of entry) Patient coming from: Home  I have personally briefly reviewed patient's old medical records in [facility]  Chief Complaint: Abdominal pain  HPI: Barbara Gregory is a 42 y.o. female with medical history significant of diverticulitis, anxiety/depression, presented to the attending of abdominal pain.  Patient has been constipated for last few days and started to develop abdominal pain cramping-like LLQ.  Usually she felt the pain is from her menstrual cycle however last night the pain became unbearable and she decided to come into the hospital.  Denied any nauseous vomiting.  She did have some subjective fever and chills and sweaty last night.  She had her first episode of diverticulitis 2 years ago treated medically but she never had colonoscopy afterward.  Her sister also had recurrent diverticulitis and underwent partial colectomy recently.  ED Course: Afebrile, borderline tachycardia nonhypoxic.  Blood work showed WBC 12.4, hemoglobin 11.7.   CT of the pelvis showed descending diverticulitis with microperforation but no pneumoperitoneum or abscess formation  Started on Zosyn in ED  Review of Systems: As per HPI otherwise 14 point review of systems negative.    Past Medical History:  Diagnosis Date   Diverticulitis     Past Surgical History:  Procedure Laterality Date   APPENDECTOMY     CHOLECYSTECTOMY     TUBAL LIGATION       reports that she has never smoked. She has never used smokeless tobacco. She reports that she does not drink alcohol. No history on file for drug use.  No Known Allergies  History reviewed. No pertinent family history.   Prior to Admission medications   Medication Sig Start Date End Date Taking? Authorizing Provider   senna-docusate (SENOKOT-S) 8.6-50 MG tablet Take 1 tablet by mouth daily.   Yes [provider]  sertraline (ZOLOFT) 100 MG tablet Take 200 mg by mouth daily in the afternoon.   Yes [provider]  ARIPiprazole (ABILIFY) 2 MG tablet Take 1 tablet by mouth daily in the afternoon. Patient not taking: Reported on 07/14/2023    [provider]  HYDROcodone-acetaminophen (NORCO/VICODIN) 5-325 MG tablet Take 1 tablet by mouth every 6 (six) hours as needed for moderate pain. Patient not taking: Reported on 07/14/2023 02/27/22   Arnaldo Natal, MD  metoCLOPramide (REGLAN) 10 MG tablet Take 1 tablet (10 mg total) by mouth 3 (three) times daily with meals for 5 days. 03/12/22 03/17/22  Menshew, Charlesetta Ivory, PA-C  ondansetron (ZOFRAN-ODT) 4 MG disintegrating tablet Take 1 tablet (4 mg total) by mouth every 8 (eight) hours as needed for nausea or vomiting. Patient not taking: Reported on 07/14/2023 02/27/22   Arnaldo Natal, MD    Physical Exam: Vitals:   07/14/23 0853 07/14/23 0854 07/14/23 1130  BP: 125/77  103/84  Pulse: (!) 102  80  Resp: 20  18  Temp: 98.6 F (37 C)    TempSrc: Oral    SpO2: 97%  99%  Weight:  72.6 kg   Height:  4\' 11"  (1.499 m)     Constitutional: NAD, calm, comfortable Vitals:   07/14/23 0853 07/14/23 0854 07/14/23 1130  BP: 125/77  103/84  Pulse: (!) 102  80  Resp: 20  18  Temp: 98.6 F (37 C)  TempSrc: Oral    SpO2: 97%  99%  Weight:  72.6 kg   Height:  4\' 11"  (1.499 m)    Eyes: PERRL, lids and conjunctivae normal ENMT: Mucous membranes are moist. Posterior pharynx clear of any exudate or lesions.Normal dentition.  Neck: normal, supple, no masses, no thyromegaly Respiratory: clear to auscultation bilaterally, no wheezing, no crackles. Normal respiratory effort. No accessory muscle use.  Cardiovascular: Regular rate and rhythm, no murmurs / rubs / gallops. No extremity edema. 2+ pedal pulses. No carotid bruits.  Abdomen: tenderness  LLQ, no rebound no guarding, no masses palpated. No hepatosplenomegaly. Bowel sounds positive.  Musculoskeletal: no clubbing / cyanosis. No joint deformity upper and lower extremities. Good ROM, no contractures. Normal muscle tone.  Skin: no rashes, lesions, ulcers. No induration Neurologic: CN 2-12 grossly intact. Sensation intact, DTR normal. Strength 5/5 in all 4.  Psychiatric: Normal judgment and insight. Alert and oriented x 3. Normal mood.     Labs on Admission: I have personally reviewed following labs and imaging studies  CBC: Recent Labs  Lab 07/14/23 0856  WBC 12.4*  HGB 11.7*  HCT 36.6  MCV 91.3  PLT 270   Basic Metabolic Panel: Recent Labs  Lab 07/14/23 0856  NA 138  K 3.5  CL 107  CO2 24  GLUCOSE 116*  BUN 15  CREATININE 0.66  CALCIUM 8.7*   GFR: Estimated Creatinine Clearance: 77.9 mL/min (by C-G formula based on SCr of 0.66 mg/dL). Liver Function Tests: Recent Labs  Lab 07/14/23 0856  AST 21  ALT 26  ALKPHOS 69  BILITOT 0.6  PROT 7.9  ALBUMIN 3.4*   Recent Labs  Lab 07/14/23 0856  LIPASE 40   No results for input(s): "AMMONIA" in the last 168 hours. Coagulation Profile: No results for input(s): "INR", "PROTIME" in the last 168 hours. Cardiac Enzymes: No results for input(s): "CKTOTAL", "CKMB", "CKMBINDEX", "TROPONINI" in the last 168 hours. BNP (last 3 results) No results for input(s): "PROBNP" in the last 8760 hours. HbA1C: No results for input(s): "HGBA1C" in the last 72 hours. CBG: No results for input(s): "GLUCAP" in the last 168 hours. Lipid Profile: No results for input(s): "CHOL", "HDL", "LDLCALC", "TRIG", "CHOLHDL", "LDLDIRECT" in the last 72 hours. Thyroid Function Tests: No results for input(s): "TSH", "T4TOTAL", "FREET4", "T3FREE", "THYROIDAB" in the last 72 hours. Anemia Panel: No results for input(s): "VITAMINB12", "FOLATE", "FERRITIN", "TIBC", "IRON", "RETICCTPCT" in the last 72 hours. Urine analysis:    Component  Value Date/Time   COLORURINE YELLOW (A) 07/14/2023 0854   APPEARANCEUR HAZY (A) 07/14/2023 0854   LABSPEC 1.016 07/14/2023 0854   PHURINE 6.0 07/14/2023 0854   GLUCOSEU NEGATIVE 07/14/2023 0854   HGBUR LARGE (A) 07/14/2023 0854   BILIRUBINUR NEGATIVE 07/14/2023 0854   KETONESUR NEGATIVE 07/14/2023 0854   PROTEINUR NEGATIVE 07/14/2023 0854   NITRITE NEGATIVE 07/14/2023 0854   LEUKOCYTESUR NEGATIVE 07/14/2023 0854    Radiological Exams on Admission: CT ABDOMEN PELVIS W CONTRAST  Result Date: 07/14/2023 CLINICAL DATA:  Left lower quadrant abdominal pain EXAM: CT ABDOMEN AND PELVIS WITH CONTRAST TECHNIQUE: Multidetector CT imaging of the abdomen and pelvis was performed using the standard protocol following bolus administration of intravenous contrast. RADIATION DOSE REDUCTION: This exam was performed according to the departmental dose-optimization program which includes automated exposure control, adjustment of the mA and/or kV according to patient size and/or use of iterative reconstruction technique. CONTRAST:  80mL OMNIPAQUE IOHEXOL 300 MG/ML  SOLN COMPARISON:  02/27/2022 FINDINGS: Lower chest:  No contributory  findings. Hepatobiliary: Negative liver.Cholecystectomy without biliary dilatation. Pancreas: Unremarkable. Spleen: Unremarkable. Adrenals/Urinary Tract: Negative adrenals. No hydronephrosis or stone. Unremarkable bladder. Stomach/Bowel: Fat inflammation surrounds a descending colonic diverticulum with small bubbles of extraluminal gas clustered in this area. No free pneumoperitoneum or collection. Multiple colonic diverticula seen along the left colon. Vascular/Lymphatic: No acute vascular abnormality. No mass or adenopathy. Reproductive:No pathologic findings. Other: No ascites or pneumoperitoneum. Musculoskeletal: No acute abnormalities. IMPRESSION: Descending diverticulitis with micro perforation. No pneumoperitoneum or abscess. Electronically Signed   By: Tiburcio Pea M.D.   On:  07/14/2023 10:50    EKG: None  Assessment/Plan Principal Problem:   Diverticulitis large intestine Active Problems:   Acute diverticulitis  (please populate well all problems here in Problem List. (For example, if patient is on BP meds at home and you resume or decide to hold them, it is a problem that needs to be her. Same for CAD, COPD, HLD and so on)  Diverticulitis, complicated -With acute perforation but no abscess formation.  No sepsis -Continue Zosyn -Clear liquid diet for now, advance as tolerated -IV Dilaudid for pain control -Given sepsis secondary to episode of diverticulitis in 2 years, I am recommending she follows up with GI for colonoscopy in 6 weeks.  DVT prophylaxis: Lovenox Code Status: Full code Family Communication: None at bedside Disposition Plan: Patient is sick with complicated diverticulitis with microperforation, requiring IV antibiotics, expect more than 2 midnight hospital stay. Consults called: None Admission status: Tele   Emeline General MD [facility] Pager [phone]  07/14/2023, 1:22 PM

## 2023-07-14 NOTE — TOC CM/SW Note (Signed)
Transition of Care [facility]) - Inpatient Brief Assessment   Patient Details  Name: Barbara Gregory MRN: [id number] Date of Birth: [birth year]  [facility] CM/SW Contact:    Kemper Durie, RN Phone Number: 07/14/2023, 2:50 PM   Clinical Narrative:  Brief assessment done, no TOC needs identified at this time  Transition of Care Asessment: Insurance and Status: Selfpay Patient has primary care physician: Yes Home environment has been reviewed: Yes Prior level of function:: Independent Prior/Current Home Services: No current home services Social Determinants of Health Reivew: SDOH reviewed no interventions necessary Readmission risk has been reviewed: Yes Transition of care needs: no transition of care needs at this time

## 2023-07-14 NOTE — Plan of Care (Signed)
  Problem: Pain Managment: Goal: General experience of comfort will improve Outcome: Progressing   

## 2023-07-14 NOTE — Progress Notes (Signed)
Pharmacy Antibiotic Note  Barbara Gregory is a 42 y.o. female w/ PMH of diverticulitis, anxiety/depression admitted on 07/14/2023 with diverticulitis.  Pharmacy has been consulted for Zosyn dosing.  Plan: Zosyn 3.375g IV q8h (4 hour infusion). ---follow renal function for needed dose adjustments  Height: 4\' 11"  (149.9 cm) Weight: 72.6 kg (160 lb) IBW/kg (Calculated) : 43.2  Temp (24hrs), Avg:98.6 F (37 C), Min:98.6 F (37 C), Max:98.6 F (37 C)  Recent Labs  Lab 07/14/23 0856  WBC 12.4*  CREATININE 0.66    Estimated Creatinine Clearance: 77.9 mL/min (by C-G formula based on SCr of 0.66 mg/dL).    No Known Allergies  Antimicrobials this admission: 08/24 Zosyn >>   Thank you for allowing pharmacy to be a part of this patient's care.  [name] 07/14/2023 1:27 PM

## 2023-07-14 NOTE — Progress Notes (Signed)
Since arriving to the floor, patient continues to have unresolved nausea and vomiting. Patient is barely keeping any oral fluids down. Ordered PRN antiemetics have been exhausted. RN contacted admitting MD.   Madie Reno, RN

## 2023-07-14 NOTE — ED Triage Notes (Signed)
Pt to ED for LLQ sharp abdominal pain since 2 days ago, hx diverticulitis. Feels nauseous and had chills last night. Pt in NAD.

## 2023-07-14 NOTE — ED Notes (Signed)
2C notified that patient is en route to floor with transporter.

## 2023-07-14 NOTE — ED Provider Notes (Signed)
[facility] Provider Note    Event Date/Time   First MD Initiated Contact with Patient 07/14/23 [phone]     (approximate)   History   Abdominal Pain   HPI  VILDA SLONECKER is a 42 y.o. female with history of diverticulitis who presents with complaints of left lower quadrant abdominal pain which is worsened over the last 2 days.  She reports the pain is moderate to severe at this time and constant.  Worse with bowel movements.     Physical Exam   Triage Vital Signs: ED Triage Vitals  Encounter Vitals Group     BP 07/14/23 0853 125/77     Systolic BP Percentile --      Diastolic BP Percentile --      Pulse Rate 07/14/23 0853 (!) 102     Resp 07/14/23 0853 20     Temp 07/14/23 0853 98.6 F (37 C)     Temp Source 07/14/23 0853 Oral     SpO2 07/14/23 0853 97 %     Weight 07/14/23 0854 72.6 kg (160 lb)     Height 07/14/23 0854 1.499 m (4\' 11" )     Head Circumference --      Peak Flow --      Pain Score 07/14/23 0852 8     Pain Loc --      Pain Education --      Exclude from Growth Chart --     Most recent vital signs: Vitals:   07/14/23 0853  BP: 125/77  Pulse: (!) 102  Resp: 20  Temp: 98.6 F (37 C)  SpO2: 97%     General: Awake, no distress.  CV:  Good peripheral perfusion.  Resp:  Normal effort.  Abd:  No distention.  Tenderness palpation the left lower quadrant, no CVA tenderness Other:     ED Results / Procedures / Treatments   Labs (all labs ordered are listed, but only abnormal results are displayed) Labs Reviewed  COMPREHENSIVE METABOLIC PANEL - Abnormal; Notable for the following components:      Result Value   Glucose, Bld 116 (*)    Calcium 8.7 (*)    Albumin 3.4 (*)    All other components within normal limits  CBC - Abnormal; Notable for the following components:   WBC 12.4 (*)    Hemoglobin 11.7 (*)    All other components within normal limits  URINALYSIS, ROUTINE W REFLEX MICROSCOPIC - Abnormal; Notable for the  following components:   Color, Urine YELLOW (*)    APPearance HAZY (*)    Hgb urine dipstick LARGE (*)    All other components within normal limits  LIPASE, BLOOD  POC URINE PREG, ED     EKG     RADIOLOGY     PROCEDURES:  Critical Care performed:   Procedures   MEDICATIONS ORDERED IN ED: Medications  piperacillin-tazobactam (ZOSYN) IVPB 3.375 g (has no administration in time range)  morphine (PF) 4 MG/ML injection 4 mg (4 mg Intravenous Given 07/14/23 0926)  ondansetron (ZOFRAN) injection 4 mg (4 mg Intravenous Given 07/14/23 0925)  iohexol (OMNIPAQUE) 300 MG/ML solution 100 mL (80 mLs Intravenous Contrast Given 07/14/23 1002)     IMPRESSION / MDM / ASSESSMENT AND PLAN / ED COURSE  I reviewed the triage vital signs and the nursing notes. Patient's presentation is most consistent with acute presentation with potential threat to life or bodily function.  Patient presents with left lower quadrant tenderness as  detailed above, suspicious for diverticulitis, diverticular abscess, colitis, less likely ureterolithiasis or UTI  Will treat with IV morphine, IV Zofran, obtain labs, CT abdomen pelvis and reevaluate.  Lab work reviewed, mildly elevated white blood cell count.  CT scan demonstrates diverticulitis with microperforation, will start IV Zosyn and discussed with the hospitalist for admission      FINAL CLINICAL IMPRESSION(S) / ED DIAGNOSES   Final diagnoses:  Diverticulitis     Rx / DC Orders   ED Discharge Orders     None        Note:  This document was prepared using Dragon voice recognition software and may include unintentional dictation errors.   Jene Every, MD 07/14/23 1130

## 2023-07-15 LAB — CBC
HCT: 31.8 % — ABNORMAL LOW (ref 36.0–46.0)
Hemoglobin: 10.3 g/dL — ABNORMAL LOW (ref 12.0–15.0)
MCH: 29.3 pg (ref 26.0–34.0)
MCHC: 32.4 g/dL (ref 30.0–36.0)
MCV: 90.6 fL (ref 80.0–100.0)
Platelets: 241 10*3/uL (ref 150–400)
RBC: 3.51 MIL/uL — ABNORMAL LOW (ref 3.87–5.11)
RDW: 14 % (ref 11.5–15.5)
WBC: 8.9 10*3/uL (ref 4.0–10.5)
nRBC: 0 % (ref 0.0–0.2)

## 2023-07-15 LAB — BASIC METABOLIC PANEL
Anion gap: 7 (ref 5–15)
BUN: 13 mg/dL (ref 6–20)
CO2: 23 mmol/L (ref 22–32)
Calcium: 7.8 mg/dL — ABNORMAL LOW (ref 8.9–10.3)
Chloride: 104 mmol/L (ref 98–111)
Creatinine, Ser: 0.45 mg/dL (ref 0.44–1.00)
GFR, Estimated: >60 mL/min (ref >60)
Glucose, Bld: 101 mg/dL — ABNORMAL HIGH (ref 70–99)
Potassium: 3.2 mmol/L — ABNORMAL LOW (ref 3.5–5.1)
Sodium: 134 mmol/L — ABNORMAL LOW (ref 135–145)

## 2023-07-15 LAB — HIV ANTIBODY (ROUTINE TESTING W REFLEX): HIV Screen 4th Generation wRfx: NONREACTIVE

## 2023-07-15 MED ORDER — SCOPOLAMINE 1 MG/3DAYS TD PT72
1.0000 | MEDICATED_PATCH | TRANSDERMAL | Status: DC
  Administered 2023-07-15: 1.5 mg via TRANSDERMAL
  Filled 2023-07-15: qty 1

## 2023-07-15 MED ORDER — POTASSIUM CHLORIDE CRYS ER 20 MEQ PO TBCR
40.0000 meq | EXTENDED_RELEASE_TABLET | Freq: Once | ORAL | Status: AC
  Administered 2023-07-15: 40 meq via ORAL
  Filled 2023-07-15: qty 2

## 2023-07-15 MED ORDER — SODIUM CHLORIDE 0.9 % IV SOLN: INTRAVENOUS | Status: DC | PRN

## 2023-07-15 NOTE — Progress Notes (Signed)
PROGRESS NOTE    Barbara Gregory  ZOX:[id number] DOB: 1981/06/22 DOA: 07/14/2023 [facility]   Assessment & Plan:   Principal Problem:   Diverticulitis large intestine Active Problems:   Acute diverticulitis  Assessment and Plan: Acute diverticulitis: w/ microperforation without abscess formation. Continue on IV zosyn. Clear liquid diet. Zofran prn for nausea/vomiting. Dilaudid prn for pain  Normocytic anemia: H&H are trending down. No need for a transfusion currently  Hypokalemia: potassium given   Obesity: BMI 32.3. Would benefit from weight loss   Depression: severity unknown. Continue on home dose of sertraline    DVT prophylaxis: SCDs Code Status: full  Family Communication:  Disposition Plan: likely will d/c back home   Level of care: Med-Surg Status is: Inpatient Remains inpatient appropriate because: severity of illness, requiring IV abxs     Consultants:    Procedures:   Antimicrobials: zosyn    Subjective: Pt c/o intermittent abd pain   Objective: Vitals:   07/14/23 1130 07/14/23 1426 07/14/23 1918 07/15/23 0356  BP: 103/84 116/73 113/68 (!) 97/58  Pulse: 80 80 74 74  Resp: 18 18 16 16   Temp:  98.6 F (37 C) 97.9 F (36.6 C) 98.3 F (36.8 C)  TempSrc:  Oral Oral Oral  SpO2: 99% 99% 97% 97%  Weight:      Height:        Intake/Output Summary (Last 24 hours) at 07/15/2023 0814 Last data filed at 07/15/2023 0538 Gross per 24 hour  Intake 1419.57 ml  Output --  Net 1419.57 ml   Filed Weights   07/14/23 0854  Weight: 72.6 kg    Examination:  General exam: Appears calm and comfortable  Respiratory system: Clear to auscultation. Respiratory effort normal. Cardiovascular system: S1 & S2+. No rubs, gallops or clicks.  Gastrointestinal system: Abdomen is nondistended, soft and nontender. Normal bowel sounds heard. Central nervous system: Alert and oriented. Moves all extremities  Psychiatry: Judgement and  insight appear normal. Flat mood and affect    Data Reviewed: I have personally reviewed following labs and imaging studies  CBC: Recent Labs  Lab 07/14/23 0856 07/15/23 0408  WBC 12.4* 8.9  HGB 11.7* 10.3*  HCT 36.6 31.8*  MCV 91.3 90.6  PLT 270 241   Basic Metabolic Panel: Recent Labs  Lab 07/14/23 0856 07/15/23 0408  NA 138 134*  K 3.5 3.2*  CL 107 104  CO2 24 23  GLUCOSE 116* 101*  BUN 15 13  CREATININE 0.66 0.45  CALCIUM 8.7* 7.8*   GFR: Estimated Creatinine Clearance: 77.9 mL/min (by C-G formula based on SCr of 0.45 mg/dL). Liver Function Tests: Recent Labs  Lab 07/14/23 0856  AST 21  ALT 26  ALKPHOS 69  BILITOT 0.6  PROT 7.9  ALBUMIN 3.4*   Recent Labs  Lab 07/14/23 0856  LIPASE 40   No results for input(s): "AMMONIA" in the last 168 hours. Coagulation Profile: No results for input(s): "INR", "PROTIME" in the last 168 hours. Cardiac Enzymes: No results for input(s): "CKTOTAL", "CKMB", "CKMBINDEX", "TROPONINI" in the last 168 hours. BNP (last 3 results) No results for input(s): "PROBNP" in the last 8760 hours. HbA1C: No results for input(s): "HGBA1C" in the last 72 hours. CBG: No results for input(s): "GLUCAP" in the last 168 hours. Lipid Profile: No results for input(s): "CHOL", "HDL", "LDLCALC", "TRIG", "CHOLHDL", "LDLDIRECT" in the last 72 hours. Thyroid Function Tests: No results for input(s): "TSH", "T4TOTAL", "FREET4", "T3FREE", "THYROIDAB" in the last 72 hours. Anemia  Panel: No results for input(s): "VITAMINB12", "FOLATE", "FERRITIN", "TIBC", "IRON", "RETICCTPCT" in the last 72 hours. Sepsis Labs: No results for input(s): "PROCALCITON", "LATICACIDVEN" in the last 168 hours.  No results found for this or any previous visit (from the past 240 hour(s)).       Radiology Studies: CT ABDOMEN PELVIS W CONTRAST  Result Date: 07/14/2023 CLINICAL DATA:  Left lower quadrant abdominal pain EXAM: CT ABDOMEN AND PELVIS WITH CONTRAST  TECHNIQUE: Multidetector CT imaging of the abdomen and pelvis was performed using the standard protocol following bolus administration of intravenous contrast. RADIATION DOSE REDUCTION: This exam was performed according to the departmental dose-optimization program which includes automated exposure control, adjustment of the mA and/or kV according to patient size and/or use of iterative reconstruction technique. CONTRAST:  80mL OMNIPAQUE IOHEXOL 300 MG/ML  SOLN COMPARISON:  02/27/2022 FINDINGS: Lower chest:  No contributory findings. Hepatobiliary: Negative liver.Cholecystectomy without biliary dilatation. Pancreas: Unremarkable. Spleen: Unremarkable. Adrenals/Urinary Tract: Negative adrenals. No hydronephrosis or stone. Unremarkable bladder. Stomach/Bowel: Fat inflammation surrounds a descending colonic diverticulum with small bubbles of extraluminal gas clustered in this area. No free pneumoperitoneum or collection. Multiple colonic diverticula seen along the left colon. Vascular/Lymphatic: No acute vascular abnormality. No mass or adenopathy. Reproductive:No pathologic findings. Other: No ascites or pneumoperitoneum. Musculoskeletal: No acute abnormalities. IMPRESSION: Descending diverticulitis with micro perforation. No pneumoperitoneum or abscess. Electronically Signed   By: Tiburcio Pea M.D.   On: 07/14/2023 10:50        Scheduled Meds:  metoCLOPramide  10 mg Oral TID WC   sertraline  150 mg Oral Q1500   Continuous Infusions:  sodium chloride 125 mL/hr at 07/15/23 0231   piperacillin-tazobactam (ZOSYN)  IV 3.375 g (07/15/23 0621)     LOS: 1 day    Time spent: 35 mins    Charise Killian, MD [facility] Pager [phone]  If 7PM-7AM, please contact night-coverage [email or web address] 07/15/2023, 8:14 AM

## 2023-07-15 NOTE — Plan of Care (Signed)
  Problem: Clinical Measurements: Goal: Ability to maintain clinical measurements within normal limits will improve Outcome: Progressing   Problem: Clinical Measurements: Goal: Will remain free from infection Outcome: Progressing   Problem: Clinical Measurements: Goal: Diagnostic test results will improve Outcome: Progressing   Problem: Health Behavior/Discharge Planning: Goal: Ability to manage health-related needs will improve Outcome: Progressing   Problem: Education: Goal: Knowledge of General Education information will improve Description: Including pain rating scale, medication(s)/side effects and non-pharmacologic comfort measures Outcome: Progressing

## 2023-07-16 LAB — CBC
HCT: 32.3 % — ABNORMAL LOW (ref 36.0–46.0)
Hemoglobin: 10.5 g/dL — ABNORMAL LOW (ref 12.0–15.0)
MCH: 29.3 pg (ref 26.0–34.0)
MCHC: 32.5 g/dL (ref 30.0–36.0)
MCV: 90.2 fL (ref 80.0–100.0)
Platelets: 274 10*3/uL (ref 150–400)
RBC: 3.58 MIL/uL — ABNORMAL LOW (ref 3.87–5.11)
RDW: 14 % (ref 11.5–15.5)
WBC: 6.1 10*3/uL (ref 4.0–10.5)
nRBC: 0 % (ref 0.0–0.2)

## 2023-07-16 LAB — BASIC METABOLIC PANEL
Anion gap: 7 (ref 5–15)
BUN: 12 mg/dL (ref 6–20)
CO2: 23 mmol/L (ref 22–32)
Calcium: 8.3 mg/dL — ABNORMAL LOW (ref 8.9–10.3)
Chloride: 107 mmol/L (ref 98–111)
Creatinine, Ser: 0.6 mg/dL (ref 0.44–1.00)
GFR, Estimated: >60 mL/min (ref >60)
Glucose, Bld: 95 mg/dL (ref 70–99)
Potassium: 3.8 mmol/L (ref 3.5–5.1)
Sodium: 137 mmol/L (ref 135–145)

## 2023-07-16 NOTE — Plan of Care (Signed)

## 2023-07-16 NOTE — Progress Notes (Addendum)
Pharmacy Antibiotic Note  Barbara Gregory is a 42 y.o. female admitted on 07/14/2023 with diverticulitis. PMH significant for diverticulitis, anxiety, depression. CTA abdomen showed descending diverticulitis with micro perforation, no pneumoperitoneum or abscess. Pharmacy has been consulted for Zosyn dosing.  Plan:  Day 3 of antibiotics Continue Zosyn 3.375 g IV Q8H Continue to monitor renal function and follow culture results   Height: 4\' 11"  (149.9 cm) Weight: 72.6 kg (160 lb) IBW/kg (Calculated) : 43.2  Temp (24hrs), Avg:98.2 F (36.8 C), Min:98 F (36.7 C), Max:98.3 F (36.8 C)  Recent Labs  Lab 07/14/23 0856 07/15/23 0408 07/16/23 0412  WBC 12.4* 8.9 6.1  CREATININE 0.66 0.45 0.60    Estimated Creatinine Clearance: 77.9 mL/min (by C-G formula based on SCr of 0.6 mg/dL).    No Known Allergies  Antimicrobials this admission: 08/24 Zosyn >>   Thank you for allowing pharmacy to be a part of this patient's care.  Celene Squibb, PharmD Clinical Pharmacist 07/16/2023 9:21 AM

## 2023-07-16 NOTE — Progress Notes (Signed)
PROGRESS NOTE    Barbara Gregory  QIH:[id number] DOB: 10-31-1981 DOA: 07/14/2023 [facility]: [facility]   Assessment & Plan:   Principal Problem:   Diverticulitis large intestine Active Problems:   Acute diverticulitis  Assessment and Plan: Acute diverticulitis: w/ microperforation without abscess formation. Still w/ abd pain today.  Continue on IV zosyn. Advanced diet to regular diet. Zofran prn for nausea/vomiting. Norco, diluadid prn   Normocytic anemia: H&H are labile. No need for a transfusion currently   Hypokalemia: WNL today    Obesity: BMI 32.3. Would benefit from weight loss   Depression: severity unknown. Continue on home dose of sertraline    DVT prophylaxis: SCDs Code Status: full  Family Communication:  Disposition Plan: likely will d/c back home   Level of care: Med-Surg Status is: Inpatient Remains inpatient appropriate because: severity of illness, requiring IV abxs     Consultants:    Procedures:   Antimicrobials: zosyn    Subjective: Pt c/o abd pain still today   Objective: Vitals:   07/15/23 1532 07/15/23 2008 07/16/23 0500 07/16/23 0813  BP: 106/76 102/70 (P) 108/72 109/69  Pulse: 73 79 (P) 75 74  Resp: 17 18 (P) 18 18  Temp:  98.3 F (36.8 C) (P) 98 F (36.7 C) 98.2 F (36.8 C)  TempSrc:  Oral (P) Oral Oral  SpO2: 99% 97% (P) 98% 98%  Weight:      Height:        Intake/Output Summary (Last 24 hours) at 07/16/2023 0822 Last data filed at 07/16/2023 0420 Gross per 24 hour  Intake 1654.99 ml  Output --  Net 1654.99 ml   Filed Weights   07/14/23 0854  Weight: 72.6 kg    Examination:  General exam: Appears comfortable  Respiratory system: clear breath sounds b/l Cardiovascular system: S1/S2+.No rubs or clicks  Gastrointestinal system: abd is soft, NT, obese & hypoactive bowel sounds  Central nervous system: Alert & oriented. Moves all extremities  Psychiatry: judgement and insight appears normal. Flat  mood and affect    Data Reviewed: I have personally reviewed following labs and imaging studies  CBC: Recent Labs  Lab 07/14/23 0856 07/15/23 0408 07/16/23 0412  WBC 12.4* 8.9 6.1  HGB 11.7* 10.3* 10.5*  HCT 36.6 31.8* 32.3*  MCV 91.3 90.6 90.2  PLT 270 241 274   Basic Metabolic Panel: Recent Labs  Lab 07/14/23 0856 07/15/23 0408 07/16/23 0412  NA 138 134* 137  K 3.5 3.2* 3.8  CL 107 104 107  CO2 24 23 23   GLUCOSE 116* 101* 95  BUN 15 13 12   CREATININE 0.66 0.45 0.60  CALCIUM 8.7* 7.8* 8.3*   GFR: Estimated Creatinine Clearance: 77.9 mL/min (by C-G formula based on SCr of 0.6 mg/dL). Liver Function Tests: Recent Labs  Lab 07/14/23 0856  AST 21  ALT 26  ALKPHOS 69  BILITOT 0.6  PROT 7.9  ALBUMIN 3.4*   Recent Labs  Lab 07/14/23 0856  LIPASE 40   No results for input(s): "AMMONIA" in the last 168 hours. Coagulation Profile: No results for input(s): "INR", "PROTIME" in the last 168 hours. Cardiac Enzymes: No results for input(s): "CKTOTAL", "CKMB", "CKMBINDEX", "TROPONINI" in the last 168 hours. BNP (last 3 results) No results for input(s): "PROBNP" in the last 8760 hours. HbA1C: No results for input(s): "HGBA1C" in the last 72 hours. CBG: No results for input(s): "GLUCAP" in the last 168 hours. Lipid Profile: No results for input(s): "CHOL", "HDL", "LDLCALC", "TRIG", "CHOLHDL", "  LDLDIRECT" in the last 72 hours. Thyroid Function Tests: No results for input(s): "TSH", "T4TOTAL", "FREET4", "T3FREE", "THYROIDAB" in the last 72 hours. Anemia Panel: No results for input(s): "VITAMINB12", "FOLATE", "FERRITIN", "TIBC", "IRON", "RETICCTPCT" in the last 72 hours. Sepsis Labs: No results for input(s): "PROCALCITON", "LATICACIDVEN" in the last 168 hours.  No results found for this or any previous visit (from the past 240 hour(s)).       Radiology Studies: CT ABDOMEN PELVIS W CONTRAST  Result Date: 07/14/2023 CLINICAL DATA:  Left lower quadrant  abdominal pain EXAM: CT ABDOMEN AND PELVIS WITH CONTRAST TECHNIQUE: Multidetector CT imaging of the abdomen and pelvis was performed using the standard protocol following bolus administration of intravenous contrast. RADIATION DOSE REDUCTION: This exam was performed according to the departmental dose-optimization program which includes automated exposure control, adjustment of the mA and/or kV according to patient size and/or use of iterative reconstruction technique. CONTRAST:  80mL OMNIPAQUE IOHEXOL 300 MG/ML  SOLN COMPARISON:  02/27/2022 FINDINGS: Lower chest:  No contributory findings. Hepatobiliary: Negative liver.Cholecystectomy without biliary dilatation. Pancreas: Unremarkable. Spleen: Unremarkable. Adrenals/Urinary Tract: Negative adrenals. No hydronephrosis or stone. Unremarkable bladder. Stomach/Bowel: Fat inflammation surrounds a descending colonic diverticulum with small bubbles of extraluminal gas clustered in this area. No free pneumoperitoneum or collection. Multiple colonic diverticula seen along the left colon. Vascular/Lymphatic: No acute vascular abnormality. No mass or adenopathy. Reproductive:No pathologic findings. Other: No ascites or pneumoperitoneum. Musculoskeletal: No acute abnormalities. IMPRESSION: Descending diverticulitis with micro perforation. No pneumoperitoneum or abscess. Electronically Signed   By: Tiburcio Pea M.D.   On: 07/14/2023 10:50        Scheduled Meds:  metoCLOPramide  10 mg Oral TID WC   scopolamine  1 patch Transdermal Q72H   sertraline  150 mg Oral Q1500   Continuous Infusions:  sodium chloride 10 mL/hr at 07/16/23 0420   piperacillin-tazobactam (ZOSYN)  IV 3.375 g (07/16/23 0454)     LOS: 2 days    Time spent: 25 mins    Charise Killian, MD [facility] Pager [phone]  If 7PM-7AM, please contact night-coverage [email or web address] 07/16/2023, 8:22 AM

## 2023-07-17 LAB — CBC
HCT: 33.7 % — ABNORMAL LOW (ref 36.0–46.0)
Hemoglobin: 11.2 g/dL — ABNORMAL LOW (ref 12.0–15.0)
MCH: 29.3 pg (ref 26.0–34.0)
MCHC: 33.2 g/dL (ref 30.0–36.0)
MCV: 88.2 fL (ref 80.0–100.0)
Platelets: 320 10*3/uL (ref 150–400)
RBC: 3.82 MIL/uL — ABNORMAL LOW (ref 3.87–5.11)
RDW: 13.7 % (ref 11.5–15.5)
WBC: 6.1 10*3/uL (ref 4.0–10.5)
nRBC: 0 % (ref 0.0–0.2)

## 2023-07-17 LAB — BASIC METABOLIC PANEL
Anion gap: 8 (ref 5–15)
BUN: 14 mg/dL (ref 6–20)
CO2: 26 mmol/L (ref 22–32)
Calcium: 8.7 mg/dL — ABNORMAL LOW (ref 8.9–10.3)
Chloride: 102 mmol/L (ref 98–111)
Creatinine, Ser: 0.66 mg/dL (ref 0.44–1.00)
GFR, Estimated: >60 mL/min (ref >60)
Glucose, Bld: 103 mg/dL — ABNORMAL HIGH (ref 70–99)
Potassium: 3.7 mmol/L (ref 3.5–5.1)
Sodium: 136 mmol/L (ref 135–145)

## 2023-07-17 MED ORDER — HYDROCODONE-ACETAMINOPHEN 5-325 MG PO TABS: 1.0000 | ORAL_TABLET | Freq: Four times a day (QID) | ORAL | 0 refills | Status: AC | PRN

## 2023-07-17 MED ORDER — AMOXICILLIN-POT CLAVULANATE 875-125 MG PO TABS: 1.0000 | ORAL_TABLET | Freq: Two times a day (BID) | ORAL | 0 refills | Status: AC

## 2023-07-17 NOTE — Discharge Summary (Signed)
Physician Discharge Summary  Barbara Gregory WUX:[id number] DOB: [birth year] DOA: 07/14/2023  PCP: [facility]  Admit date: 07/14/2023 Discharge date: 07/17/2023  Admitted From: home  Disposition:  home   Recommendations for Outpatient Follow-up:  Follow up with PCP in 1-2 weeks   Home Health: no Equipment/Devices:  Discharge Condition: stable  CODE STATUS: full  Diet recommendation: Regular  Brief/Interim Summary: HPI was taken from Dr. Irving Burton: Barbara Gregory is a 42 y.o. female with medical history significant of diverticulitis, anxiety/depression, presented to the attending of abdominal pain.   Patient has been constipated for last few days and started to develop abdominal pain cramping-like LLQ.  Usually she felt the pain is from her menstrual cycle however last night the pain became unbearable and she decided to come into the hospital.  Denied any nauseous vomiting.  She did have some subjective fever and chills and sweaty last night.  She had her first episode of diverticulitis 2 years ago treated medically but she never had colonoscopy afterward.  Her sister also had recurrent diverticulitis and underwent partial colectomy recently.   ED Course: Afebrile, borderline tachycardia nonhypoxic.  Blood work showed WBC 12.4, hemoglobin 11.7.    CT of the pelvis showed descending diverticulitis with microperforation but no pneumoperitoneum or abscess formation   Started on Zosyn in ED  Discharge Diagnoses:  Principal Problem:   Diverticulitis large intestine Active Problems:   Acute diverticulitis  Acute diverticulitis: w/ microperforation without abscess formation. Continue on IV zosyn while inpatient and d/c home augmentin to complete the courae. Advanced diet to regular diet. Zofran prn for nausea/vomiting. Norco, diluadid prn    Normocytic anemia: H&H are labile. No need for a transfusion currently    Hypokalemia: WNL today     Obesity: BMI 32.3. Would  benefit from weight loss    Depression: severity unknown. Continue on home dose of sertraline   Discharge Instructions  Discharge Instructions     Diet general   Complete by: As directed    Discharge instructions   Complete by: As directed    F/u w/ PCP in 1-2 weeks   Increase activity slowly   Complete by: As directed       Allergies as of 07/17/2023   No Known Allergies      Medication List     STOP taking these medications    metoCLOPramide 10 MG tablet Commonly known as: REGLAN       TAKE these medications    amoxicillin-clavulanate 875-125 MG tablet Commonly known as: AUGMENTIN Take 1 tablet by mouth 2 (two) times daily for 7 days.   ARIPiprazole 2 MG tablet Commonly known as: ABILIFY Take 1 tablet by mouth daily in the afternoon.   HYDROcodone-acetaminophen 5-325 MG tablet Commonly known as: NORCO/VICODIN Take 1 tablet by mouth every 6 (six) hours as needed for up to 3 days for moderate pain or severe pain. What changed: reasons to take this   ondansetron 4 MG disintegrating tablet Commonly known as: ZOFRAN-ODT Take 1 tablet (4 mg total) by mouth every 8 (eight) hours as needed for nausea or vomiting.   senna-docusate 8.6-50 MG tablet Commonly known as: Senokot-S Take 1 tablet by mouth daily.   sertraline 100 MG tablet Commonly known as: ZOLOFT Take 200 mg by mouth daily in the afternoon.        No Known Allergies  Consultations:    Procedures/Studies: CT ABDOMEN PELVIS W CONTRAST  Result Date: 07/14/2023 CLINICAL DATA:  Left lower quadrant abdominal pain EXAM: CT ABDOMEN AND PELVIS WITH CONTRAST TECHNIQUE: Multidetector CT imaging of the abdomen and pelvis was performed using the standard protocol following bolus administration of intravenous contrast. RADIATION DOSE REDUCTION: This exam was performed according to the departmental dose-optimization program which includes automated exposure control, adjustment of the mA and/or kV  according to patient size and/or use of iterative reconstruction technique. CONTRAST:  80mL OMNIPAQUE IOHEXOL 300 MG/ML  SOLN COMPARISON:  02/27/2022 FINDINGS: Lower chest:  No contributory findings. Hepatobiliary: Negative liver.Cholecystectomy without biliary dilatation. Pancreas: Unremarkable. Spleen: Unremarkable. Adrenals/Urinary Tract: Negative adrenals. No hydronephrosis or stone. Unremarkable bladder. Stomach/Bowel: Fat inflammation surrounds a descending colonic diverticulum with small bubbles of extraluminal gas clustered in this area. No free pneumoperitoneum or collection. Multiple colonic diverticula seen along the left colon. Vascular/Lymphatic: No acute vascular abnormality. No mass or adenopathy. Reproductive:No pathologic findings. Other: No ascites or pneumoperitoneum. Musculoskeletal: No acute abnormalities. IMPRESSION: Descending diverticulitis with micro perforation. No pneumoperitoneum or abscess. Electronically Signed   By: Tiburcio Pea M.D.   On: 07/14/2023 10:50   (Echo, Carotid, EGD, Colonoscopy, ERCP)    Subjective: Pt c/o fatigue    Discharge Exam: Vitals:   07/17/23 0402 07/17/23 0750  BP: 110/69 109/68  Pulse: 64 71  Resp: 18 15  Temp: 98.1 F (36.7 C) 98.4 F (36.9 C)  SpO2: 98% 98%   Vitals:   07/16/23 0813 07/16/23 2025 07/17/23 0402 07/17/23 0750  BP: 109/69 (!) 97/53 110/69 109/68  Pulse: 74 91 64 71  Resp: 18 20 18 15   Temp: 98.2 F (36.8 C) 99.4 F (37.4 C) 98.1 F (36.7 C) 98.4 F (36.9 C)  TempSrc: Oral Oral Oral Oral  SpO2: 98% 98% 98% 98%  Weight:      Height:        General: Pt is alert, awake, not in acute distress Cardiovascular: S1/S2 +, no rubs, no gallops Respiratory: CTA bilaterally, no wheezing, no rhonchi Abdominal: Soft, NT, obese, bowel sounds + Extremities: no edema, no cyanosis    The results of significant diagnostics from this hospitalization (including imaging, microbiology, ancillary and laboratory) are listed  below for reference.     Microbiology: No results found for this or any previous visit (from the past 240 hour(s)).   Labs: BNP (last 3 results) No results for input(s): "BNP" in the last 8760 hours. Basic Metabolic Panel: Recent Labs  Lab 07/14/23 0856 07/15/23 0408 07/16/23 0412 07/17/23 0507  NA 138 134* 137 136  K 3.5 3.2* 3.8 3.7  CL 107 104 107 102  CO2 24 23 23 26   GLUCOSE 116* 101* 95 103*  BUN 15 13 12 14   CREATININE 0.66 0.45 0.60 0.66  CALCIUM 8.7* 7.8* 8.3* 8.7*   Liver Function Tests: Recent Labs  Lab 07/14/23 0856  AST 21  ALT 26  ALKPHOS 69  BILITOT 0.6  PROT 7.9  ALBUMIN 3.4*   Recent Labs  Lab 07/14/23 0856  LIPASE 40   No results for input(s): "AMMONIA" in the last 168 hours. CBC: Recent Labs  Lab 07/14/23 0856 07/15/23 0408 07/16/23 0412 07/17/23 0507  WBC 12.4* 8.9 6.1 6.1  HGB 11.7* 10.3* 10.5* 11.2*  HCT 36.6 31.8* 32.3* 33.7*  MCV 91.3 90.6 90.2 88.2  PLT 270 241 274 320   Cardiac Enzymes: No results for input(s): "CKTOTAL", "CKMB", "CKMBINDEX", "TROPONINI" in the last 168 hours. BNP: Invalid input(s): "POCBNP" CBG: No results for input(s): "GLUCAP" in the last 168 hours. D-Dimer No results for  input(s): "DDIMER" in the last 72 hours. Hgb A1c No results for input(s): "HGBA1C" in the last 72 hours. Lipid Profile No results for input(s): "CHOL", "HDL", "LDLCALC", "TRIG", "CHOLHDL", "LDLDIRECT" in the last 72 hours. Thyroid function studies No results for input(s): "TSH", "T4TOTAL", "T3FREE", "THYROIDAB" in the last 72 hours.  Invalid input(s): "FREET3" Anemia work up No results for input(s): "VITAMINB12", "FOLATE", "FERRITIN", "TIBC", "IRON", "RETICCTPCT" in the last 72 hours. Urinalysis    Component Value Date/Time   COLORURINE YELLOW (A) 07/14/2023 0854   APPEARANCEUR HAZY (A) 07/14/2023 0854   LABSPEC 1.016 07/14/2023 0854   PHURINE 6.0 07/14/2023 0854   GLUCOSEU NEGATIVE 07/14/2023 0854   HGBUR LARGE (A)  07/14/2023 0854   BILIRUBINUR NEGATIVE 07/14/2023 0854   KETONESUR NEGATIVE 07/14/2023 0854   PROTEINUR NEGATIVE 07/14/2023 0854   NITRITE NEGATIVE 07/14/2023 0854   LEUKOCYTESUR NEGATIVE 07/14/2023 0854   Sepsis Labs Recent Labs  Lab 07/14/23 0856 07/15/23 0408 07/16/23 0412 07/17/23 0507  WBC 12.4* 8.9 6.1 6.1   Microbiology No results found for this or any previous visit (from the past 240 hour(s)).   Time coordinating discharge: Over 30 minutes  SIGNED:   Charise Killian, MD  [facility] 07/17/2023, 11:53 AM Pager   If 7PM-7AM, please contact night-coverage [email or web address]

## 2023-07-17 NOTE — Plan of Care (Signed)

## 2023-07-17 NOTE — Plan of Care (Signed)
  Problem: Education: Goal: Knowledge of General Education information will improve Description: Including pain rating scale, medication(s)/side effects and non-pharmacologic comfort measures Outcome: Adequate for Discharge   Problem: Health Behavior/Discharge Planning: Goal: Ability to manage health-related needs will improve Outcome: Adequate for Discharge   Problem: Clinical Measurements: Goal: Ability to maintain clinical measurements within normal limits will improve Outcome: Adequate for Discharge   Problem: Clinical Measurements: Goal: Will remain free from infection Outcome: Adequate for Discharge   Problem: Clinical Measurements: Goal: Respiratory complications will improve Outcome: Adequate for Discharge   Problem: Clinical Measurements: Goal: Cardiovascular complication will be avoided Outcome: Adequate for Discharge   

## 2024-01-17 ENCOUNTER — Emergency Department
Admission: EM | Admit: 2024-01-17 | Discharge: 2024-01-17 | Disposition: A | Discharge status: Home / Self Care | Payer: BC Managed Care – PPO | Attending: Emergency Medicine | Admitting: Emergency Medicine

## 2024-01-17 ENCOUNTER — Other Ambulatory Visit: Payer: Self-pay

## 2024-01-17 ENCOUNTER — Emergency Department: Payer: BC Managed Care – PPO

## 2024-01-17 DIAGNOSIS — K5730 Diverticulosis of large intestine without perforation or abscess without bleeding: Secondary | ICD-10-CM | POA: Insufficient documentation

## 2024-01-17 DIAGNOSIS — D72829 Elevated white blood cell count, unspecified: Secondary | ICD-10-CM | POA: Diagnosis not present

## 2024-01-17 DIAGNOSIS — R1032 Left lower quadrant pain: Secondary | ICD-10-CM | POA: Diagnosis present

## 2024-01-17 DIAGNOSIS — R112 Nausea with vomiting, unspecified: Secondary | ICD-10-CM

## 2024-01-17 LAB — COMPREHENSIVE METABOLIC PANEL
ALT: 28 U/L (ref 0–44)
AST: 20 U/L (ref 15–41)
Albumin: 4.1 g/dL (ref 3.5–5.0)
Alkaline Phosphatase: 71 U/L (ref 38–126)
Anion gap: 9 (ref 5–15)
BUN: 15 mg/dL (ref 6–20)
CO2: 24 mmol/L (ref 22–32)
Calcium: 8.9 mg/dL (ref 8.9–10.3)
Chloride: 100 mmol/L (ref 98–111)
Creatinine, Ser: 0.58 mg/dL (ref 0.44–1.00)
GFR, Estimated: >60 mL/min (ref >60)
Glucose, Bld: 127 mg/dL — ABNORMAL HIGH (ref 70–99)
Potassium: 4.1 mmol/L (ref 3.5–5.1)
Sodium: 133 mmol/L — ABNORMAL LOW (ref 135–145)
Total Bilirubin: 0.7 mg/dL (ref 0.0–1.2)
Total Protein: 8 g/dL (ref 6.5–8.1)

## 2024-01-17 LAB — URINALYSIS, ROUTINE W REFLEX MICROSCOPIC
Bilirubin Urine: NEGATIVE
Glucose, UA: NEGATIVE mg/dL
Hgb urine dipstick: NEGATIVE
Ketones, ur: NEGATIVE mg/dL
Nitrite: NEGATIVE
Protein, ur: NEGATIVE mg/dL
RBC / HPF: 0 RBC/hpf (ref 0–5)
Specific Gravity, Urine: 1.005 (ref 1.005–1.030)
pH: 7 (ref 5.0–8.0)

## 2024-01-17 LAB — CBC
HCT: 35.7 % — ABNORMAL LOW (ref 36.0–46.0)
Hemoglobin: 11.3 g/dL — ABNORMAL LOW (ref 12.0–15.0)
MCH: 26.8 pg (ref 26.0–34.0)
MCHC: 31.7 g/dL (ref 30.0–36.0)
MCV: 84.8 fL (ref 80.0–100.0)
Platelets: 284 10*3/uL (ref 150–400)
RBC: 4.21 MIL/uL (ref 3.87–5.11)
RDW: 13.8 % (ref 11.5–15.5)
WBC: 14.2 10*3/uL — ABNORMAL HIGH (ref 4.0–10.5)
nRBC: 0 % (ref 0.0–0.2)

## 2024-01-17 LAB — POC URINE PREG, ED: Preg Test, Ur: NEGATIVE

## 2024-01-17 LAB — HCG, QUANTITATIVE, PREGNANCY: hCG, Beta Chain, Quant, S: <1 m[IU]/mL (ref <5)

## 2024-01-17 LAB — LIPASE, BLOOD: Lipase: 39 U/L (ref 11–51)

## 2024-01-17 MED ORDER — ONDANSETRON 4 MG PO TBDP
4.0000 mg | ORAL_TABLET | Freq: Once | ORAL | Status: AC | PRN
  Administered 2024-01-17: 4 mg via ORAL
  Filled 2024-01-17: qty 1

## 2024-01-17 MED ORDER — HYDROCODONE-ACETAMINOPHEN 5-325 MG PO TABS: 2.0000 | ORAL_TABLET | Freq: Three times a day (TID) | ORAL | 0 refills | Status: DC | PRN

## 2024-01-17 MED ORDER — DICYCLOMINE HCL 20 MG PO TABS: 20.0000 mg | ORAL_TABLET | Freq: Three times a day (TID) | ORAL | 0 refills | Status: DC | PRN

## 2024-01-17 MED ORDER — IOHEXOL 300 MG/ML  SOLN
100.0000 mL | Freq: Once | INTRAMUSCULAR | Status: AC | PRN
  Administered 2024-01-17: 100 mL via INTRAVENOUS

## 2024-01-17 MED ORDER — SODIUM CHLORIDE 0.9 % IV BOLUS (SEPSIS)
1000.0000 mL | Freq: Once | INTRAVENOUS | Status: AC
  Administered 2024-01-17: 1000 mL via INTRAVENOUS

## 2024-01-17 MED ORDER — AMOXICILLIN-POT CLAVULANATE 875-125 MG PO TABS: 1.0000 | ORAL_TABLET | Freq: Two times a day (BID) | ORAL | 0 refills | Status: DC

## 2024-01-17 MED ORDER — ONDANSETRON 4 MG PO TBDP: 4.0000 mg | ORAL_TABLET | Freq: Four times a day (QID) | ORAL | 0 refills | Status: DC | PRN

## 2024-01-17 MED ORDER — MORPHINE SULFATE (PF) 4 MG/ML IV SOLN
4.0000 mg | Freq: Once | INTRAVENOUS | Status: AC
  Administered 2024-01-17: 4 mg via INTRAVENOUS
  Filled 2024-01-17: qty 1

## 2024-01-17 MED ORDER — ONDANSETRON HCL 4 MG/2ML IJ SOLN
4.0000 mg | Freq: Once | INTRAMUSCULAR | Status: AC
  Administered 2024-01-17: 4 mg via INTRAVENOUS
  Filled 2024-01-17: qty 2

## 2024-01-17 NOTE — ED Notes (Signed)
 ED Provider at bedside.

## 2024-01-17 NOTE — ED Provider Notes (Signed)
 [facility] Provider Note    Event Date/Time   First MD Initiated Contact with Patient 01/17/24 0451     (approximate)   History   Abdominal Pain   HPI  Barbara Gregory is a 43 y.o. female with history of prior cholecystectomy, appendectomy, tubal ligation, diverticulitis who presents to the emergency department with left lower quadrant abdominal pain.  States symptoms started today and are severe in nature.  No fevers.  Has had vomiting.  No diarrhea, bloody stools or melena.  No dysuria, hematuria, vaginal bleeding or discharge.  Patient was admitted to the hospital in August 2024 for diverticulitis with microperforation without abscess.   History provided by patient.    Past Medical History:  Diagnosis Date   Diverticulitis     Past Surgical History:  Procedure Laterality Date   APPENDECTOMY     CHOLECYSTECTOMY     TUBAL LIGATION      MEDICATIONS:  Prior to Admission medications   Medication Sig Start Date End Date Taking? Authorizing Provider  ARIPiprazole (ABILIFY) 2 MG tablet Take 1 tablet by mouth daily in the afternoon. Patient not taking: Reported on 07/14/2023    [provider]  ondansetron (ZOFRAN-ODT) 4 MG disintegrating tablet Take 1 tablet (4 mg total) by mouth every 8 (eight) hours as needed for nausea or vomiting. Patient not taking: Reported on 07/14/2023 02/27/22   Arnaldo Natal, MD  senna-docusate (SENOKOT-S) 8.6-50 MG tablet Take 1 tablet by mouth daily.    [provider]  sertraline (ZOLOFT) 100 MG tablet Take 200 mg by mouth daily in the afternoon.    [provider]    Physical Exam   Triage Vital Signs: ED Triage Vitals  Encounter Vitals Group     BP 01/17/24 0328 (!) 148/91     Systolic BP Percentile --      Diastolic BP Percentile --      Pulse Rate 01/17/24 0328 88     Resp 01/17/24 0328 18     Temp 01/17/24 0328 98.6 F (37 C)     Temp Source 01/17/24 0328 Oral     SpO2 01/17/24  0328 97 %     Weight 01/17/24 0321 140 lb (63.5 kg)     Height 01/17/24 0321 4\' 11"  (1.499 m)     Head Circumference --      Peak Flow --      Pain Score 01/17/24 0321 8     Pain Loc --      Pain Education --      Exclude from Growth Chart --     Most recent vital signs: Vitals:   01/17/24 0328 01/17/24 0500  BP: (!) 148/91 122/76  Pulse: 88 89  Resp: 18 16  Temp: 98.6 F (37 C)   SpO2: 97% 98%    CONSTITUTIONAL: Alert, responds appropriately to questions.  Appears very uncomfortable HEAD: Normocephalic, atraumatic EYES: Conjunctivae clear, pupils appear equal, sclera nonicteric ENT: normal nose; moist mucous membranes NECK: Supple, normal ROM CARD: RRR; S1 and S2 appreciated RESP: Normal chest excursion without splinting or tachypnea; breath sounds clear and equal bilaterally; no wheezes, no rhonchi, no rales, no hypoxia or respiratory distress, speaking full sentences ABD/GI: Non-distended; soft, tender in the left lower quadrant with guarding, no rebound BACK: The back appears normal EXT: Normal ROM in all joints; no deformity noted, no edema SKIN: Normal color for age and race; warm; no rash on exposed skin NEURO: Moves all extremities  equally, normal speech PSYCH: The patient's mood and manner are appropriate.   ED Results / Procedures / Treatments   LABS: (all labs ordered are listed, but only abnormal results are displayed) Labs Reviewed  COMPREHENSIVE METABOLIC PANEL - Abnormal; Notable for the following components:      Result Value   Sodium 133 (*)    Glucose, Bld 127 (*)    All other components within normal limits  CBC - Abnormal; Notable for the following components:   WBC 14.2 (*)    Hemoglobin 11.3 (*)    HCT 35.7 (*)    All other components within normal limits  URINALYSIS, ROUTINE W REFLEX MICROSCOPIC - Abnormal; Notable for the following components:   Color, Urine STRAW (*)    APPearance CLEAR (*)    Leukocytes,Ua TRACE (*)    Bacteria, UA  RARE (*)    All other components within normal limits  LIPASE, BLOOD  HCG, QUANTITATIVE, PREGNANCY  POC URINE PREG, ED     EKG:   RADIOLOGY: My personal review and interpretation of imaging: CT shows no acute abnormality.  No diverticulitis.  I have personally reviewed all radiology reports.   CT ABDOMEN PELVIS W CONTRAST Result Date: 01/17/2024 CLINICAL DATA:  43 year old female with left lower quadrant abdominal pain since 0100 hours. History of distal colon diverticulitis with micro perforation. EXAM: CT ABDOMEN AND PELVIS WITH CONTRAST TECHNIQUE: Multidetector CT imaging of the abdomen and pelvis was performed using the standard protocol following bolus administration of intravenous contrast. RADIATION DOSE REDUCTION: This exam was performed according to the departmental dose-optimization program which includes automated exposure control, adjustment of the mA and/or kV according to patient size and/or use of iterative reconstruction technique. CONTRAST:  OMNIPAQUE IOHEXOL 300 MG/ML  SOLN COMPARISON:  CT Abdomen and Pelvis 03/10/2023. FINDINGS: Lower chest: Lower lung volumes with mild atelectasis. No pericardial or pleural effusion. Heart size at the upper limits of normal. Hepatobiliary: Chronic cholecystectomy. Borderline to mild hepatic steatosis. Pancreas: Negative. Spleen: Negative. Adrenals/Urinary Tract: Stable and negative. Incidental pelvic phleboliths. Stomach/Bowel: Resolved inflammation previously demonstrated in the distal descending colon. Resolved soft tissue thickening and stranding in the left gutter there. Underlying diverticulosis. Completely decompressed appearance of the large bowel now from the splenic flexure distally. Negative upstream transverse colon except for occasional diverticula. Negative right colon, diminutive or absent appendix. Fluid in nondilated small bowel loops. Decompressed stomach and duodenum. No free air, free fluid, or mesenteric inflammation  identified. Incidental umbilical piercing. Vascular/Lymphatic: Major arterial structures and the portal venous system appear patent and within normal limits. No calcified atherosclerosis or lymphadenopathy identified. Reproductive: Within normal limits. Other: No pelvis free fluid.  Chronic pelvic phleboliths. Musculoskeletal: Stable. Lower thoracic spina bifida occulta (normal variant). Chronic SI joint degeneration. No acute osseous abnormality identified. IMPRESSION: Resolved descending colon inflammation since August. Underlying diverticulosis. No acute or inflammatory process identified in the abdomen or pelvis. Electronically Signed   By: Odessa Fleming M.D.   On: 01/17/2024 06:12     PROCEDURES:  Critical Care performed: No    Procedures    IMPRESSION / MDM / ASSESSMENT AND PLAN / ED COURSE  I reviewed the triage vital signs and the nursing notes.    Patient here with left lower quadrant abdominal pain, vomiting.    DIFFERENTIAL DIAGNOSIS (includes but not limited to):   Diverticulitis, perforation, abscess, colitis, bowel obstruction, UTI, kidney stone, pyelonephritis, viral gastroenteritis   Patient's presentation is most consistent with acute presentation with potential threat to  life or bodily function.   PLAN: Labs obtained from triage show leukocytosis of 14,000.  Normal LFTs and lipase.  Will add on hCG.  Will obtain urine sample.  Will obtain CT of the abdomen pelvis given guarding on exam.  Will give pain and nausea medicine.   MEDICATIONS GIVEN IN ED: Medications  ondansetron (ZOFRAN-ODT) disintegrating tablet 4 mg (4 mg Oral Given 01/17/24 0327)  sodium chloride 0.9 % bolus 1,000 mL (0 mLs Intravenous Stopped 01/17/24 0627)  morphine (PF) 4 MG/ML injection 4 mg (4 mg Intravenous Given 01/17/24 0516)  ondansetron (ZOFRAN) injection 4 mg (4 mg Intravenous Given 01/17/24 0516)  iohexol (OMNIPAQUE) 300 MG/ML solution 100 mL (100 mLs Intravenous Contrast Given 01/17/24 0533)      ED COURSE: Patient CT scan reviewed and interpreted by myself and the radiologist.  Shows diverticulosis without diverticulitis.  No bowel obstruction.  No other acute abnormality seen.  Discussed with patient that symptoms could be due to viral gastroenteritis or early developing diverticulitis but given pain now well-controlled and she is tolerating p.o., I feel she is safe for discharge with outpatient follow-up.  Will discharge with pain and nausea medicine.  Will also discharge with prescription of Augmentin that she can start in 3 days if symptoms are not improving in case this is early developing diverticulitis given her history of microperforation.  I recommended bland diet for the next several days.  Patient verbalized understanding and is comfortable with this plan.    At this time, I do not feel there is any life-threatening condition present. I reviewed all nursing notes, vitals, pertinent previous records.  All lab and urine results, EKGs, imaging ordered have been independently reviewed and interpreted by myself.  I reviewed all available radiology reports from any imaging ordered this visit.  Based on my assessment, I feel the patient is safe to be discharged home without further emergent workup and can continue workup as an outpatient as needed. Discussed all findings, treatment plan as well as usual and customary return precautions.  They verbalize understanding and are comfortable with this plan.  Outpatient follow-up has been provided as needed.  All questions have been answered.  CONSULTS:  none   OUTSIDE RECORDS REVIEWED: Reviewed patient's last admission in August 2024 for diverticulitis.       FINAL CLINICAL IMPRESSION(S) / ED DIAGNOSES   Final diagnoses:  LLQ abdominal pain  Nausea and vomiting in adult     Rx / DC Orders   ED Discharge Orders          Ordered    amoxicillin-clavulanate (AUGMENTIN) 875-125 MG tablet  2 times daily        01/17/24 0646     ondansetron (ZOFRAN-ODT) 4 MG disintegrating tablet  Every 6 hours PRN        01/17/24 0646    HYDROcodone-acetaminophen (NORCO/VICODIN) 5-325 MG tablet  Every 8 hours PRN        01/17/24 0646    dicyclomine (BENTYL) 20 MG tablet  Every 8 hours PRN        01/17/24 0646             Note:  This document was prepared using Dragon voice recognition software and may include unintentional dictation errors.   Shanae Luo, Layla Maw, DO 01/17/24 [phone]

## 2024-01-17 NOTE — ED Triage Notes (Signed)
 Pt to ED via POV c/o LLQ abd pain that started around 0100 this morning. Endorses nausea. Took tylenol ago. Hx diverticulitis

## 2024-01-17 NOTE — Discharge Instructions (Addendum)
 I suspect you may have a viral gastroenteritis causing your symptoms.  I recommend a bland diet for the next several days.  Your CT scan showed no diverticulitis today or other acute abnormality.  However given your history of diverticulitis with bowel perforation, we are sending you with a prescription for antibiotics (Augmentin) that you can start if symptoms have not improved in the next 3 days.   You are being provided a prescription for opiates (also known as narcotics) for pain control.  Opiates can be addictive and should only be used when absolutely necessary for pain control when other alternatives do not work.  We recommend you only use them for the recommended amount of time and only as prescribed.  Please do not take with other sedative medications or alcohol.  Please do not drive, operate machinery, make important decisions while taking opiates.  Please note that these medications can be addictive and have high abuse potential.  Patients can become addicted to narcotics after only taking them for a few days.  Please keep these medications locked away from children, teenagers or any family members with history of substance abuse.  Narcotic pain medicine may also make you constipated.  You may use over-the-counter medications such as MiraLAX, Colace to prevent constipation.  If you become constipated, you may use over-the-counter enemas as needed.  Itching and nausea are also common side effects of narcotic pain medication.  If you develop uncontrolled vomiting or a rash, please stop these medications and seek medical care.

## 2024-03-18 ENCOUNTER — Encounter: Admitting: Obstetrics and Gynecology

## 2024-03-18 DIAGNOSIS — D252 Subserosal leiomyoma of uterus: Secondary | ICD-10-CM

## 2024-03-18 DIAGNOSIS — N83201 Unspecified ovarian cyst, right side: Secondary | ICD-10-CM

## 2024-03-18 DIAGNOSIS — Z7689 Persons encountering health services in other specified circumstances: Secondary | ICD-10-CM

## 2024-04-22 ENCOUNTER — Encounter: Payer: Self-pay | Admitting: Obstetrics and Gynecology

## 2024-04-22 ENCOUNTER — Ambulatory Visit (INDEPENDENT_AMBULATORY_CARE_PROVIDER_SITE_OTHER): Admitting: Obstetrics and Gynecology

## 2024-04-22 VITALS — BP 114/72 | HR 79 | Ht 59.0 in | Wt 157.1 lb

## 2024-04-22 DIAGNOSIS — N921 Excessive and frequent menstruation with irregular cycle: Secondary | ICD-10-CM | POA: Diagnosis not present

## 2024-04-22 DIAGNOSIS — D252 Subserosal leiomyoma of uterus: Secondary | ICD-10-CM | POA: Diagnosis not present

## 2024-04-22 DIAGNOSIS — N83201 Unspecified ovarian cyst, right side: Secondary | ICD-10-CM | POA: Diagnosis not present

## 2024-04-22 DIAGNOSIS — Z7689 Persons encountering health services in other specified circumstances: Secondary | ICD-10-CM | POA: Diagnosis not present

## 2024-04-22 DIAGNOSIS — D219 Benign neoplasm of connective and other soft tissue, unspecified: Secondary | ICD-10-CM

## 2024-04-22 NOTE — Progress Notes (Signed)
 HPI:      [name] is a 43 y.o. [phone] who LMP was Patient's last menstrual period was 04/13/2024.  Subjective:   She presents today to discuss her heavy menstrual bleeding and uterine fibroids.  She has been having very heavy menstrual bleeding for several months.  She also has significant and intermittent pain throughout the rest of her cycle.  She recently was seen at [facility] and had an ultrasound which shows multiple small uterine fibroids.  She states that she has been very tired and feels rundown. Of significant note, patient has a tubal ligation for birth control Previous obstetric history includes 2 cesarean deliveries and 1 vaginal birth.    Hx: The following portions of the patient's history were reviewed and updated as appropriate:             She  has a past medical history of Diverticulitis. She does not have any pertinent problems on file. She  has a past surgical history that includes Cholecystectomy; Tubal ligation; and Appendectomy. Her family history is not on file. She  reports that she has never smoked. She has never used smokeless tobacco. She reports that she does not currently use drugs. She reports that she does not drink alcohol. She has a current medication list which includes the following prescription(s): sertraline . She has no known allergies.       Review of Systems:  Review of Systems  Constitutional: Denied constitutional symptoms, night sweats, recent illness, fatigue, fever, insomnia and weight loss.  Eyes: Denied eye symptoms, eye pain, photophobia, vision change and visual disturbance.  Ears/Nose/Throat/Neck: Denied ear, nose, throat or neck symptoms, hearing loss, nasal discharge, sinus congestion and sore throat.  Cardiovascular: Denied cardiovascular symptoms, arrhythmia, chest pain/pressure, edema, exercise intolerance, orthopnea and palpitations.  Respiratory: Denied pulmonary symptoms, asthma, pleuritic pain, productive sputum, cough, dyspnea  and wheezing.  Gastrointestinal: Denied, gastro-esophageal reflux, melena, nausea and vomiting.  Genitourinary: See HPI for additional information.  Musculoskeletal: Denied musculoskeletal symptoms, stiffness, swelling, muscle weakness and myalgia.  Dermatologic: Denied dermatology symptoms, rash and scar.  Neurologic: Denied neurology symptoms, dizziness, headache, neck pain and syncope.  Psychiatric: Denied psychiatric symptoms, anxiety and depression.  Endocrine: Denied endocrine symptoms including hot flashes and night sweats.   Meds:   Current Outpatient Medications on File Prior to Visit  Medication Sig Dispense Refill   sertraline  (ZOLOFT ) 100 MG tablet Take 200 mg by mouth daily in the afternoon.     No current facility-administered medications on file prior to visit.      Objective:      Vitals:   04/22/24 0817  BP: 114/72  Pulse: 79   Filed Weights   04/22/24 0817  Weight: 157 lb 1.6 oz (71.3 kg)              Ultrasound results reviewed and discussed with the patient.          Assessment:     [id number] Patient Active Problem List   Diagnosis Date Noted   Diverticulitis large intestine 07/14/2023   Acute diverticulitis 03/04/2020     1. Establishing care with new doctor, encounter for   2. Fibroids   3. Fibroids, subserous   4. Right ovarian cyst   5. Menorrhagia with irregular cycle        Plan:            1.  We have discussed uterine fibroids, their natural course and history and their benign nature.  Multiple strategies for  management of pelvic pain and heavy bleeding discussed.  These include hormonal control, UFE, endometrial ablation, and hysterectomy.  Because of her previous tubal ligation I believe that she is a poor candidate for ablation.  She is a poor candidate for UFE based on the small size of the fibroids.  She has chosen IUD for control.  She will consider hysterectomy in the future if she fails IUD.  All questions  answered.  Orders Orders Placed This Encounter  Procedures   CBC    No orders of the defined types were placed in this encounter.     F/U  Return in about 3 weeks (around 05/13/2024). For IUD placement. Delice Felt, M.D. 04/22/2024 8:54 AM

## 2024-04-22 NOTE — Progress Notes (Signed)
 Patient presents today to discuss pelvic pain and fibroids. She reports pain and heavy cycles over the past year along with fatigue.

## 2024-04-23 LAB — CBC
Hematocrit: 33.9 % — ABNORMAL LOW (ref 34.0–46.6)
Hemoglobin: 10.5 g/dL — ABNORMAL LOW (ref 11.1–15.9)
MCH: 26.1 pg — ABNORMAL LOW (ref 26.6–33.0)
MCHC: 31 g/dL — ABNORMAL LOW (ref 31.5–35.7)
MCV: 84 fL (ref 79–97)
Platelets: 280 10*3/uL (ref 150–450)
RBC: 4.03 x10E6/uL (ref 3.77–5.28)
RDW: 15.5 % — ABNORMAL HIGH (ref 11.7–15.4)
WBC: 6.9 10*3/uL (ref 3.4–10.8)

## 2024-04-25 ENCOUNTER — Ambulatory Visit: Payer: Self-pay

## 2024-05-14 ENCOUNTER — Ambulatory Visit: Admitting: Obstetrics and Gynecology

## 2024-05-14 DIAGNOSIS — D219 Benign neoplasm of connective and other soft tissue, unspecified: Secondary | ICD-10-CM

## 2024-05-14 DIAGNOSIS — Z30430 Encounter for insertion of intrauterine contraceptive device: Secondary | ICD-10-CM

## 2024-05-14 DIAGNOSIS — N921 Excessive and frequent menstruation with irregular cycle: Secondary | ICD-10-CM

## 2024-05-14 DIAGNOSIS — Z124 Encounter for screening for malignant neoplasm of cervix: Secondary | ICD-10-CM

## 2024-07-30 ENCOUNTER — Emergency Department
Admission: EM | Admit: 2024-07-30 | Discharge: 2024-07-30 | Disposition: A | Discharge status: Home / Self Care | Attending: Emergency Medicine | Admitting: Emergency Medicine

## 2024-07-30 ENCOUNTER — Other Ambulatory Visit: Payer: Self-pay

## 2024-07-30 ENCOUNTER — Encounter: Payer: Self-pay | Admitting: Emergency Medicine

## 2024-07-30 ENCOUNTER — Emergency Department

## 2024-07-30 DIAGNOSIS — N3001 Acute cystitis with hematuria: Secondary | ICD-10-CM | POA: Insufficient documentation

## 2024-07-30 DIAGNOSIS — D72829 Elevated white blood cell count, unspecified: Secondary | ICD-10-CM | POA: Insufficient documentation

## 2024-07-30 DIAGNOSIS — R1032 Left lower quadrant pain: Secondary | ICD-10-CM | POA: Diagnosis present

## 2024-07-30 LAB — URINALYSIS, ROUTINE W REFLEX MICROSCOPIC
Bilirubin Urine: NEGATIVE
Glucose, UA: NEGATIVE mg/dL
Ketones, ur: NEGATIVE mg/dL
Nitrite: NEGATIVE
Protein, ur: 30 mg/dL — AB
Specific Gravity, Urine: 1.013 (ref 1.005–1.030)
WBC, UA: >50 WBC/hpf (ref 0–5)
pH: 7 (ref 5.0–8.0)

## 2024-07-30 LAB — CBC
HCT: 35 % — ABNORMAL LOW (ref 36.0–46.0)
Hemoglobin: 11.3 g/dL — ABNORMAL LOW (ref 12.0–15.0)
MCH: 27.7 pg (ref 26.0–34.0)
MCHC: 32.3 g/dL (ref 30.0–36.0)
MCV: 85.8 fL (ref 80.0–100.0)
Platelets: 311 K/uL (ref 150–400)
RBC: 4.08 MIL/uL (ref 3.87–5.11)
RDW: 15.9 % — ABNORMAL HIGH (ref 11.5–15.5)
WBC: 11.8 K/uL — ABNORMAL HIGH (ref 4.0–10.5)
nRBC: 0 % (ref 0.0–0.2)

## 2024-07-30 LAB — BASIC METABOLIC PANEL WITH GFR
Anion gap: 13 (ref 5–15)
BUN: 15 mg/dL (ref 6–20)
CO2: 22 mmol/L (ref 22–32)
Calcium: 8.6 mg/dL — ABNORMAL LOW (ref 8.9–10.3)
Chloride: 103 mmol/L (ref 98–111)
Creatinine, Ser: 0.58 mg/dL (ref 0.44–1.00)
GFR, Estimated: >60 mL/min (ref >60)
Glucose, Bld: 115 mg/dL — ABNORMAL HIGH (ref 70–99)
Potassium: 4.1 mmol/L (ref 3.5–5.1)
Sodium: 138 mmol/L (ref 135–145)

## 2024-07-30 LAB — POC URINE PREG, ED: Preg Test, Ur: NEGATIVE

## 2024-07-30 MED ORDER — CEPHALEXIN 500 MG PO CAPS: 500.0000 mg | ORAL_CAPSULE | Freq: Three times a day (TID) | ORAL | 0 refills | Status: AC

## 2024-07-30 MED ORDER — NAPROXEN 500 MG PO TABS: 500.0000 mg | ORAL_TABLET | Freq: Two times a day (BID) | ORAL | 0 refills | Status: AC

## 2024-07-30 NOTE — Discharge Instructions (Signed)
 Please follow-up with primary care for symptoms that are not improving over the next few days.  In addition to the medication prescribed today, you may also take Tylenol .  Use a heating pad off-and-on 20 minutes/h while awake.  Return to the emergency department for symptoms change or worsen if you are unable to schedule an appointment.

## 2024-07-30 NOTE — ED Provider Notes (Signed)
 [facility] Provider Note    Event Date/Time   First MD Initiated Contact with Patient 07/30/24 1015     (approximate)   History   Flank Pain   HPI  Barbara Gregory is a 43 y.o. female  with history of diverticulitis and as listed in EMR presents to the emergency department for treatment and evaluation of left side lower abdominal pain and left flank pain since 3am.  Patient reports that this does not feel like her previous episodes of diverticulitis.  No known fever..     Physical Exam    Vitals:   07/30/24 0905  BP: 130/82  Pulse: 84  Resp: 17  Temp: 98.4 F (36.9 C)  SpO2: 97%    General: Awake, no distress.  CV:  Good peripheral perfusion.  Resp:  Normal effort.  Abd:  No distention.  No guarding or rebound. Other:  No CVA tenderness.   ED Results / Procedures / Treatments   Labs (all labs ordered are listed, but only abnormal results are displayed)  Labs Reviewed  URINALYSIS, ROUTINE W REFLEX MICROSCOPIC - Abnormal; Notable for the following components:      Result Value   Color, Urine YELLOW (*)    APPearance HAZY (*)    Hgb urine dipstick SMALL (*)    Protein, ur 30 (*)    Leukocytes,Ua SMALL (*)    Bacteria, UA RARE (*)    All other components within normal limits  BASIC METABOLIC PANEL WITH GFR - Abnormal; Notable for the following components:   Glucose, Bld 115 (*)    Calcium 8.6 (*)    All other components within normal limits  CBC - Abnormal; Notable for the following components:   WBC 11.8 (*)    Hemoglobin 11.3 (*)    HCT 35.0 (*)    RDW 15.9 (*)    All other components within normal limits  POC URINE PREG, ED     EKG  Not indicated   RADIOLOGY  Image and radiology report reviewed and interpreted by me. Radiology report consistent with the same.  CT renal stone study negative for acute concerns.  PROCEDURES:  Critical Care performed: No  Procedures   MEDICATIONS ORDERED IN ED:  Medications -  No data to display   IMPRESSION / MDM / ASSESSMENT AND PLAN / ED COURSE   I have reviewed the triage note and vital signs. Vital signs stable.   Differential diagnosis includes, but is not limited to, acute cystitis, pyelonephritis, nephrolithiasis, ureterolithiasis  Patient's presentation is most consistent with acute illness / injury with system symptoms.  43 year old female presenting to the emergency department for treatment and evaluation of left lower quadrant abdominal pain and left flank pain that started about 3 AM associated with dysuria.  See HPI for further details.  Urinalysis shows hazy specimen with small amount of hemoglobin, leukocytes, greater than 50 white blood cell, rare bacteria.  Urine pregnancy test is negative.  Lab studies are overall reassuring.  CT for renal stone is negative for acute concerns.  Results discussed with the patient.  Plan will be to treat her with Keflex  and Naprosyn  and have her follow-up with primary care if her symptoms not improving over the next couple days.  ER return precautions discussed as well.  Patient discharged stable condition. SABRA     FINAL CLINICAL IMPRESSION(S) / ED DIAGNOSES   Final diagnoses:  Acute cystitis with hematuria     Rx / DC Orders  ED Discharge Orders          Ordered    cephALEXin  (KEFLEX ) 500 MG capsule  3 times daily        07/30/24 1131    naproxen  (NAPROSYN ) 500 MG tablet  2 times daily with meals        07/30/24 1131             Note:  This document was prepared using Dragon voice recognition software and may include unintentional dictation errors.   Herlinda Kirk NOVAK, FNP 07/30/24 1143    Suzanne Kirsch, MD 07/30/24 [phone]

## 2024-07-30 NOTE — ED Triage Notes (Signed)
 Patient to ED via POV for left sided flank pain. Started at 0330. States burning with urination and nausea. Hx of kidney stones
